# Patient Record
Sex: Female | Born: 1965 | Race: White | Hispanic: No | Marital: Married | Smoking: Current every day smoker
Health system: Southern US, Community
[De-identification: ages and names within clinical notes are randomized; demographics above are authoritative.]

## PROBLEM LIST (undated history)

## (undated) DIAGNOSIS — F32A Depression, unspecified: Secondary | ICD-10-CM

## (undated) DIAGNOSIS — N289 Disorder of kidney and ureter, unspecified: Secondary | ICD-10-CM

## (undated) DIAGNOSIS — F329 Major depressive disorder, single episode, unspecified: Secondary | ICD-10-CM

## (undated) DIAGNOSIS — I471 Supraventricular tachycardia, unspecified: Secondary | ICD-10-CM

## (undated) DIAGNOSIS — N390 Urinary tract infection, site not specified: Secondary | ICD-10-CM

## (undated) DIAGNOSIS — K219 Gastro-esophageal reflux disease without esophagitis: Secondary | ICD-10-CM

## (undated) DIAGNOSIS — D4959 Neoplasm of unspecified behavior of other genitourinary organ: Secondary | ICD-10-CM

## (undated) DIAGNOSIS — T8859XA Other complications of anesthesia, initial encounter: Secondary | ICD-10-CM

## (undated) DIAGNOSIS — I10 Essential (primary) hypertension: Secondary | ICD-10-CM

## (undated) DIAGNOSIS — I2510 Atherosclerotic heart disease of native coronary artery without angina pectoris: Secondary | ICD-10-CM

## (undated) DIAGNOSIS — Z8489 Family history of other specified conditions: Secondary | ICD-10-CM

## (undated) DIAGNOSIS — J449 Chronic obstructive pulmonary disease, unspecified: Secondary | ICD-10-CM

## (undated) DIAGNOSIS — I351 Nonrheumatic aortic (valve) insufficiency: Secondary | ICD-10-CM

## (undated) DIAGNOSIS — T4145XA Adverse effect of unspecified anesthetic, initial encounter: Secondary | ICD-10-CM

## (undated) HISTORY — PX: ABDOMINAL HYSTERECTOMY: SHX81

## (undated) HISTORY — DX: Atherosclerotic heart disease of native coronary artery without angina pectoris: I25.10

## (undated) HISTORY — DX: Gastro-esophageal reflux disease without esophagitis: K21.9

## (undated) HISTORY — PX: CHOLECYSTECTOMY: SHX55

## (undated) HISTORY — PX: VESICOVAGINAL FISTULA CLOSURE W/ TAH: SUR271

## (undated) HISTORY — DX: Supraventricular tachycardia, unspecified: I47.10

## (undated) HISTORY — DX: Depression, unspecified: F32.A

## (undated) HISTORY — DX: Major depressive disorder, single episode, unspecified: F32.9

## (undated) HISTORY — DX: Nonrheumatic aortic (valve) insufficiency: I35.1

## (undated) HISTORY — DX: Supraventricular tachycardia: I47.1

## (undated) HISTORY — PX: OTHER SURGICAL HISTORY: SHX169

## (undated) HISTORY — DX: Essential (primary) hypertension: I10

## (undated) HISTORY — PX: APPENDECTOMY: SHX54

---
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
## 2005-02-01 ENCOUNTER — Emergency Department: Payer: Self-pay | Admitting: General Practice

## 2005-03-23 ENCOUNTER — Ambulatory Visit: Payer: Self-pay

## 2005-03-24 ENCOUNTER — Ambulatory Visit: Payer: Self-pay | Admitting: Internal Medicine

## 2005-11-20 ENCOUNTER — Ambulatory Visit: Payer: Self-pay | Admitting: Internal Medicine

## 2006-04-07 ENCOUNTER — Ambulatory Visit: Payer: Self-pay | Admitting: Internal Medicine

## 2006-05-26 ENCOUNTER — Ambulatory Visit: Payer: Self-pay | Admitting: Internal Medicine

## 2006-06-03 ENCOUNTER — Ambulatory Visit: Payer: Self-pay | Admitting: Internal Medicine

## 2006-08-12 ENCOUNTER — Ambulatory Visit: Payer: Self-pay

## 2006-10-13 ENCOUNTER — Encounter
Admission: RE | Admit: 2006-10-13 | Discharge: 2006-10-13 | Payer: Self-pay | Admitting: Physical Medicine and Rehabilitation

## 2007-01-25 ENCOUNTER — Ambulatory Visit (HOSPITAL_COMMUNITY): Admission: RE | Admit: 2007-01-25 | Discharge: 2007-01-25 | Payer: Self-pay | Admitting: Neurosurgery

## 2007-04-28 ENCOUNTER — Emergency Department: Payer: Self-pay

## 2007-05-15 ENCOUNTER — Emergency Department: Payer: Self-pay | Admitting: Emergency Medicine

## 2007-06-13 IMAGING — CR DG CERVICAL SPINE COMPLETE 4+V
5 series · 5 of 5 positions shown · non-contrast
Comparison: none

CLINICAL DATA: Pain without trauma

Cervical spine five-view:
No previous for comparison. There is no evidence of cervical spine fracture or
prevertebral soft tissue swelling.  Alignment is normal.  No other significant
bone abnormalities are identified.

[w c-spine lat]
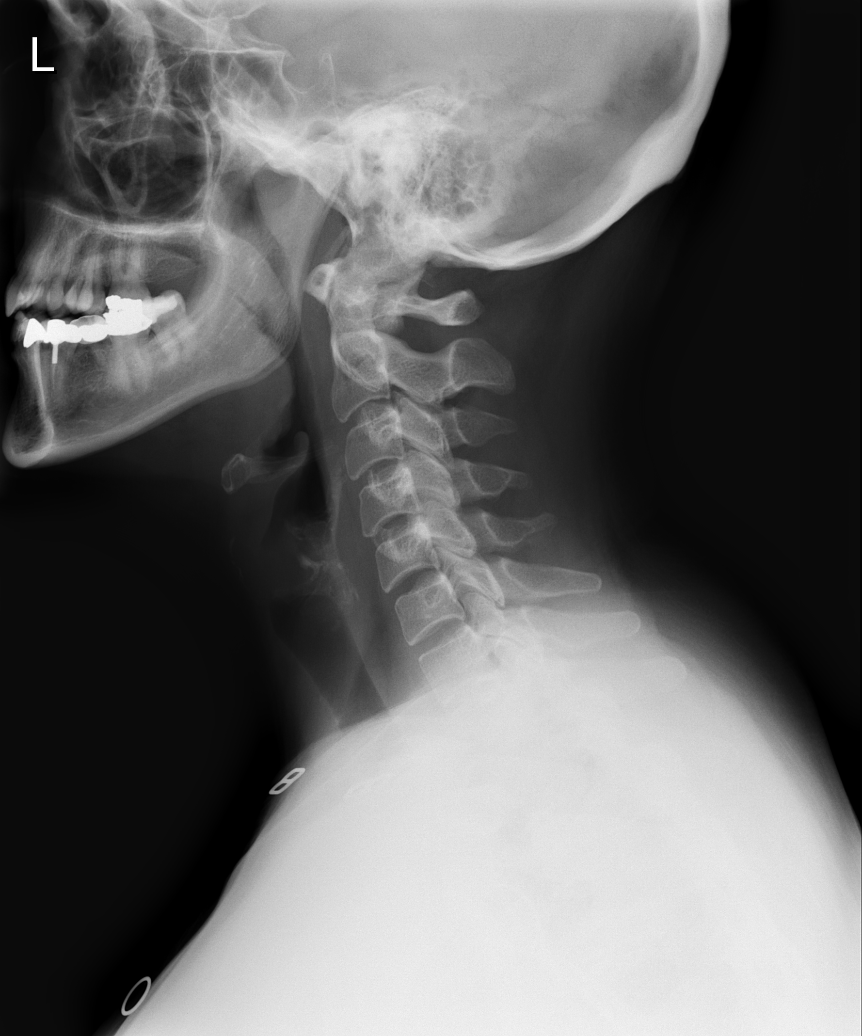

[w c-spine oblique (1 of 2)]
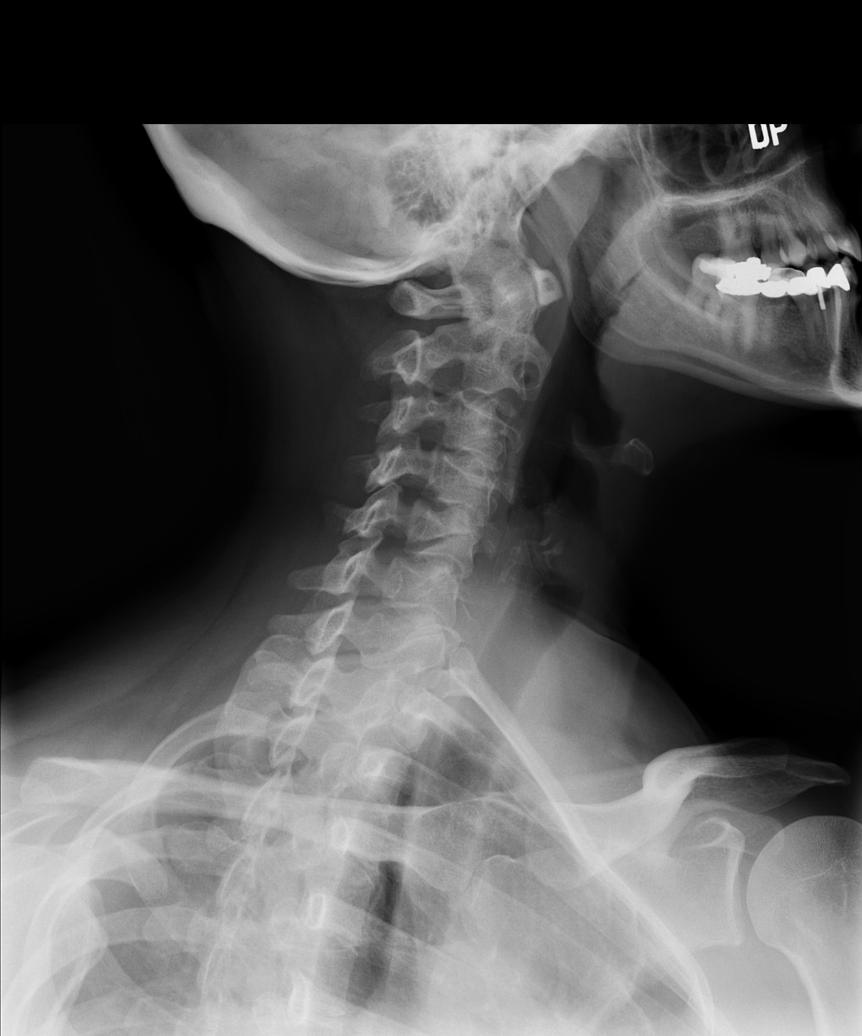

[w c-spine oblique (2 of 2)]
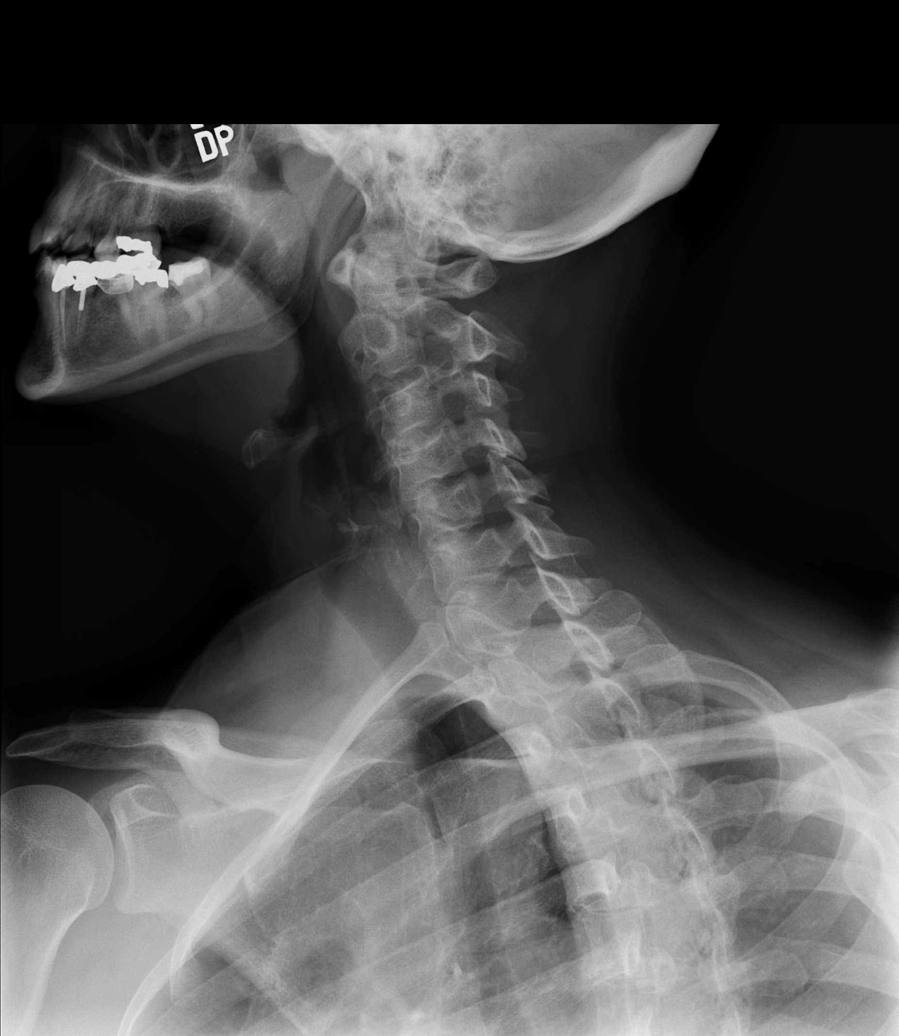

[w c-spine a.p. *]
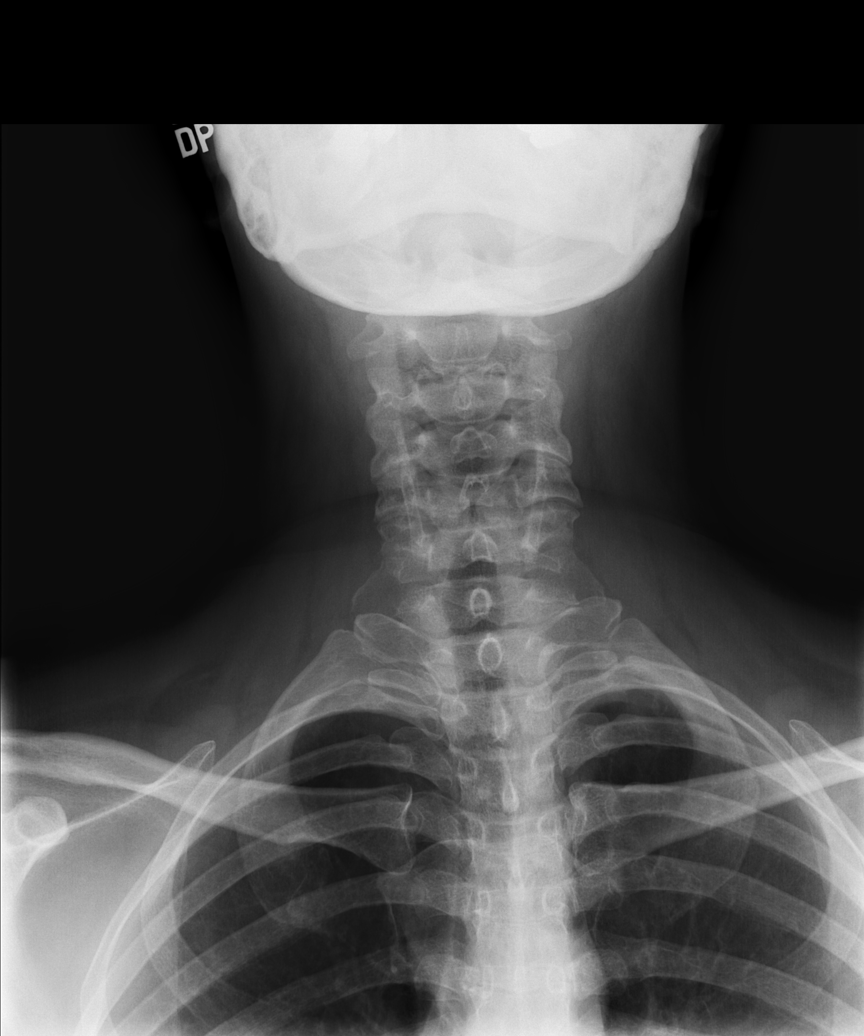

[w c-spine odontoid *]
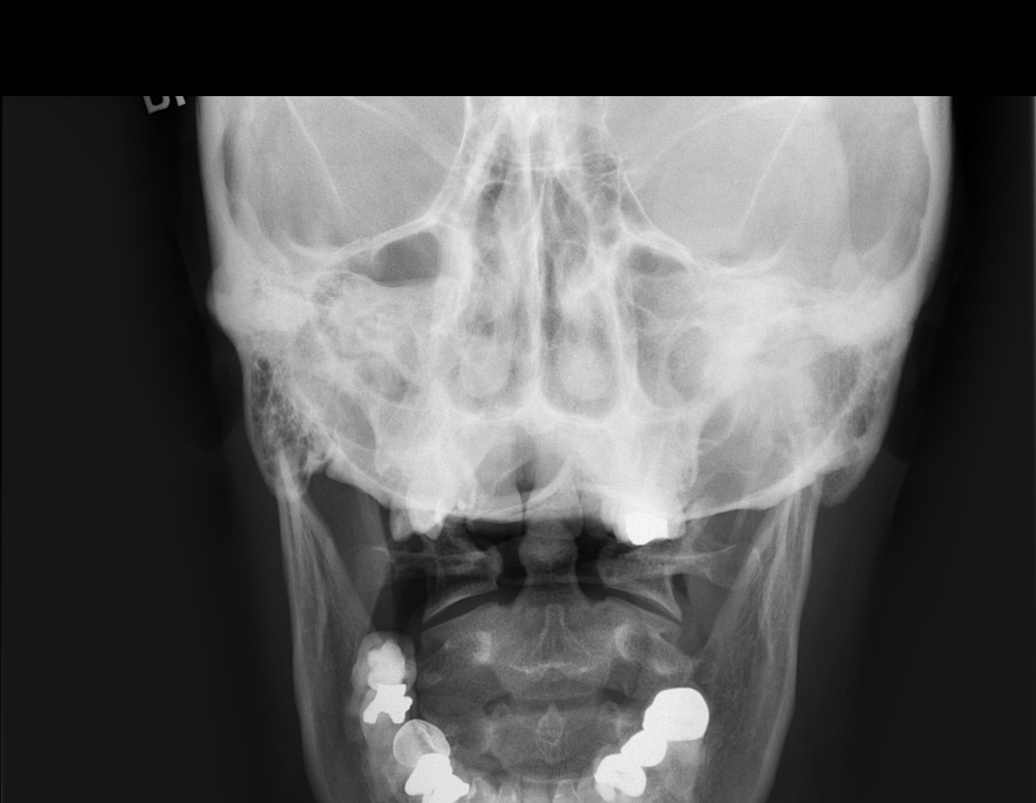

[5 of 5 positions shown; findings below may reference images not displayed]

IMPRESSION: Negative cervical spine radiographs.

## 2007-06-28 ENCOUNTER — Ambulatory Visit: Payer: Self-pay | Admitting: Pain Medicine

## 2007-07-06 ENCOUNTER — Ambulatory Visit: Payer: Self-pay | Admitting: Pain Medicine

## 2007-08-29 ENCOUNTER — Emergency Department: Payer: Self-pay | Admitting: Internal Medicine

## 2007-09-01 ENCOUNTER — Ambulatory Visit: Payer: Self-pay | Admitting: Gastroenterology

## 2007-09-05 ENCOUNTER — Ambulatory Visit: Payer: Self-pay | Admitting: Surgery

## 2007-09-21 ENCOUNTER — Ambulatory Visit: Payer: Self-pay | Admitting: Internal Medicine

## 2008-02-23 DIAGNOSIS — M545 Low back pain, unspecified: Secondary | ICD-10-CM | POA: Insufficient documentation

## 2008-05-21 IMAGING — CR DG CHEST 2V
1 series · 2 of 2 positions shown · non-contrast
Comparison: none

REASON FOR EXAM: cough fever
COMMENTS:

PROCEDURE:     DXR - DXR CHEST PA (OR AP) AND LATERAL  - September 21, 2007  [DATE]
RESULT:     The lungs are clear. The heart and pulmonary vessels are normal.
The bony and mediastinal structures are unremarkable. There is no effusion.
There is no pneumothorax or evidence of congestive failure.

[Series 1: view not recorded · 0.17mm/px · 2 of 2 slices shown]
[im 1/2]
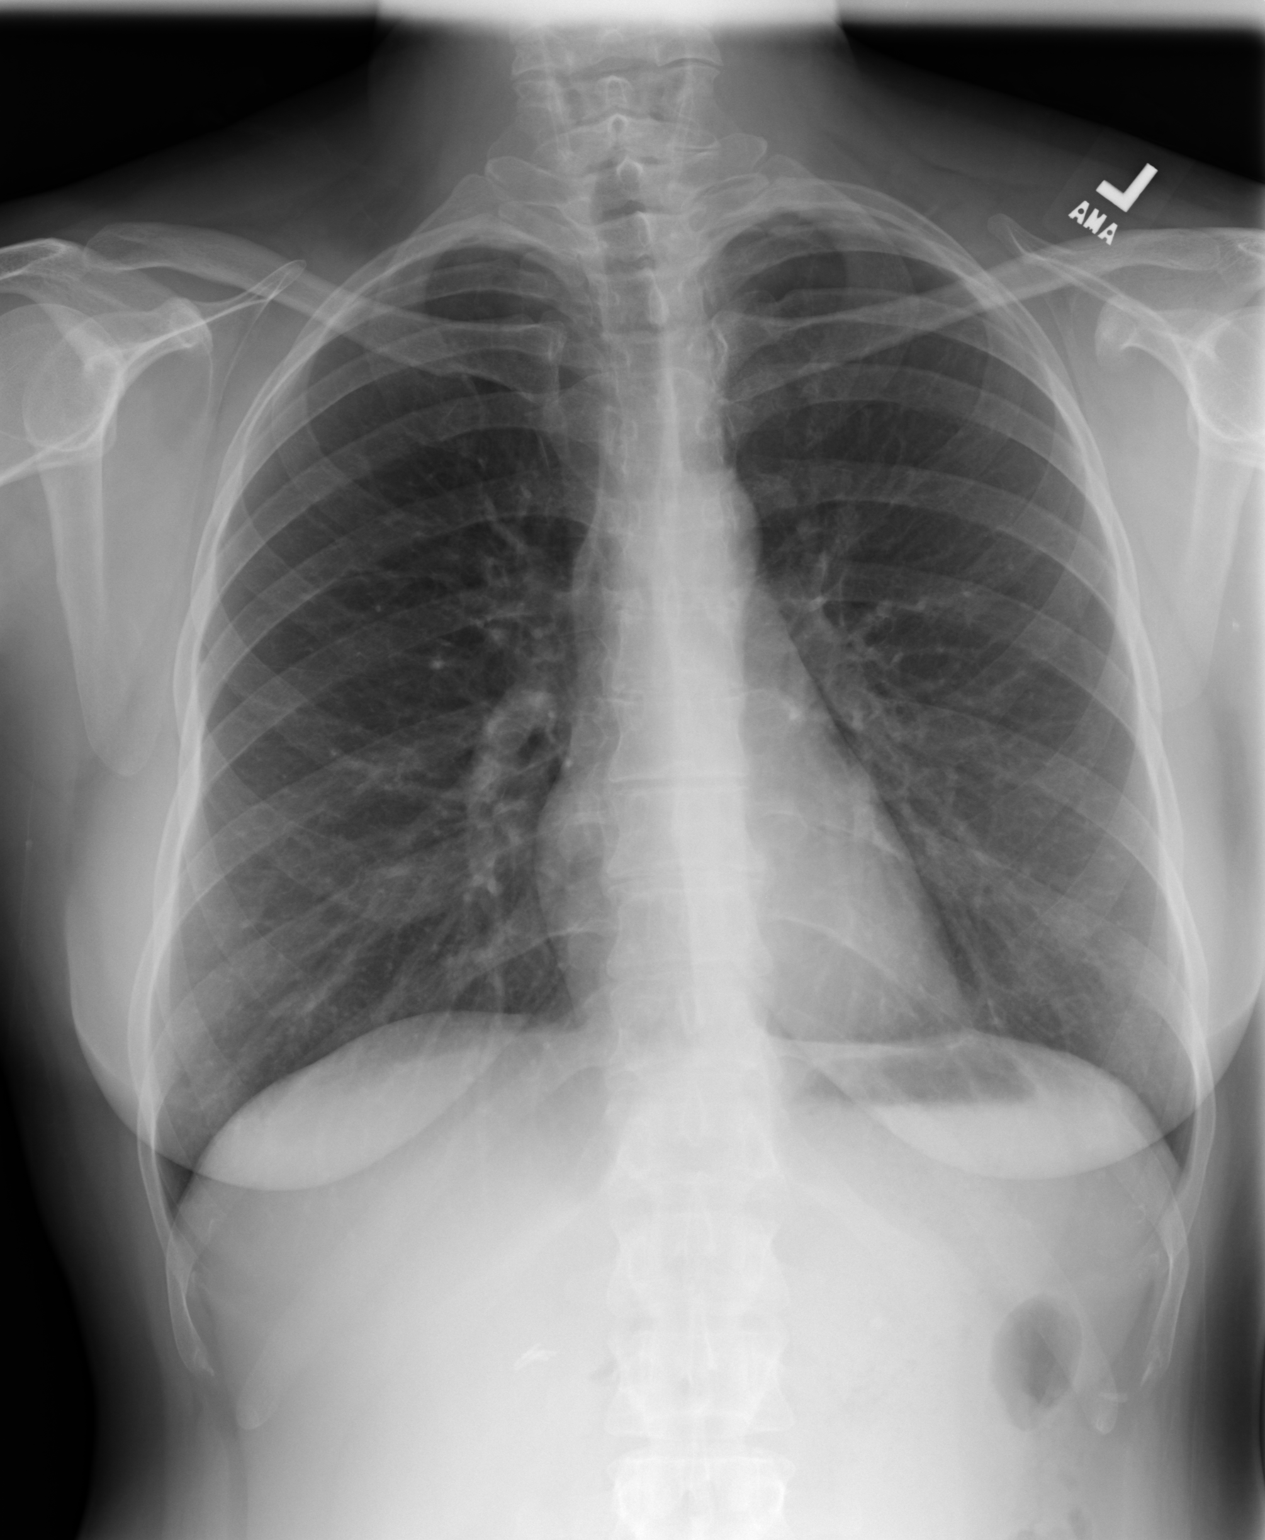
[im 2/2]
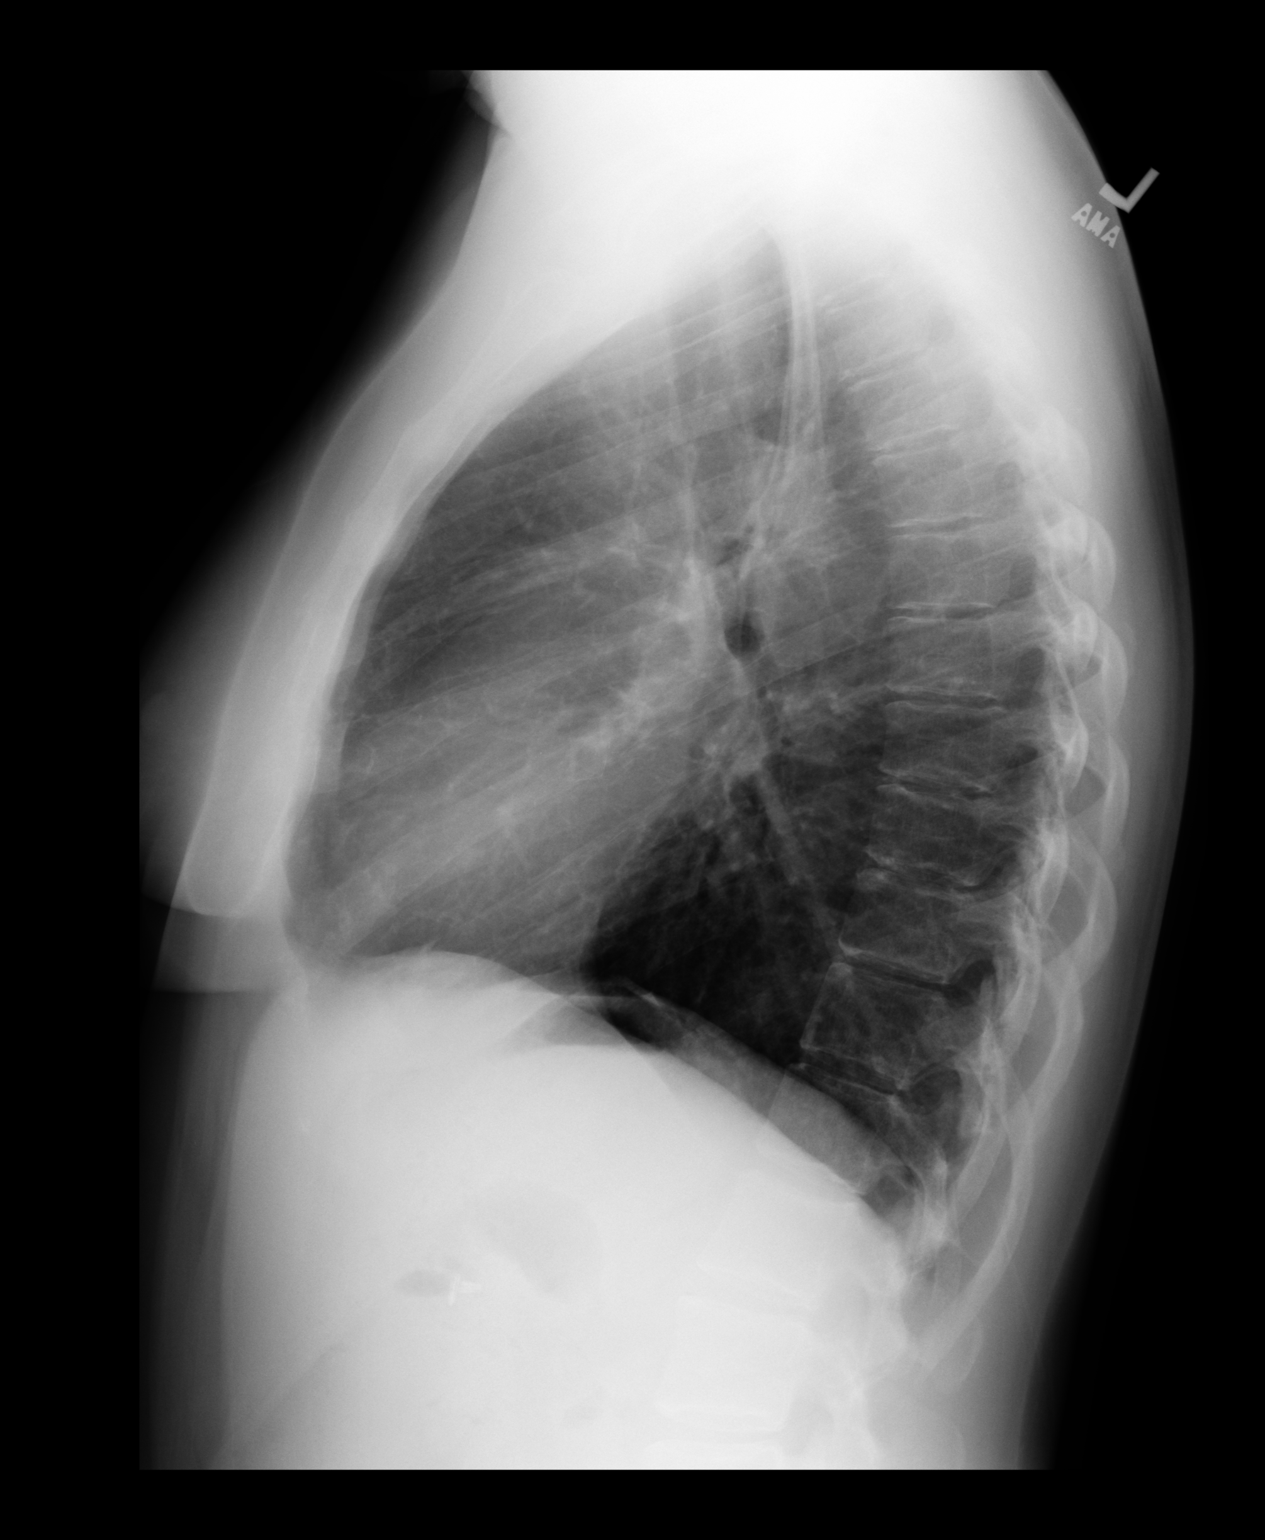

[2 of 2 positions shown; findings below may reference images not displayed]

IMPRESSION: No acute cardiopulmonary disease.

## 2008-06-12 ENCOUNTER — Emergency Department: Payer: Self-pay | Admitting: Internal Medicine

## 2009-03-21 ENCOUNTER — Ambulatory Visit: Payer: Self-pay | Admitting: Oncology

## 2009-04-08 ENCOUNTER — Ambulatory Visit: Payer: Self-pay | Admitting: Oncology

## 2009-04-16 ENCOUNTER — Ambulatory Visit: Payer: Self-pay | Admitting: Family Medicine

## 2009-04-20 ENCOUNTER — Ambulatory Visit: Payer: Self-pay | Admitting: Oncology

## 2009-05-30 ENCOUNTER — Ambulatory Visit: Payer: Self-pay | Admitting: Internal Medicine

## 2009-07-04 ENCOUNTER — Ambulatory Visit: Payer: Self-pay | Admitting: Internal Medicine

## 2009-08-12 ENCOUNTER — Ambulatory Visit (HOSPITAL_COMMUNITY): Admission: RE | Admit: 2009-08-12 | Discharge: 2009-08-12 | Payer: Self-pay | Admitting: Neurosurgery

## 2010-06-16 ENCOUNTER — Emergency Department: Payer: Self-pay | Admitting: Emergency Medicine

## 2010-06-16 ENCOUNTER — Encounter: Payer: Self-pay | Admitting: Cardiovascular Disease

## 2010-06-17 ENCOUNTER — Encounter: Payer: Self-pay | Admitting: Cardiovascular Disease

## 2010-06-19 ENCOUNTER — Ambulatory Visit: Payer: Self-pay | Admitting: Cardiovascular Disease

## 2010-06-19 DIAGNOSIS — I471 Supraventricular tachycardia, unspecified: Secondary | ICD-10-CM | POA: Insufficient documentation

## 2010-06-19 DIAGNOSIS — R072 Precordial pain: Secondary | ICD-10-CM | POA: Insufficient documentation

## 2010-06-19 DIAGNOSIS — R079 Chest pain, unspecified: Secondary | ICD-10-CM | POA: Insufficient documentation

## 2010-06-19 DIAGNOSIS — I498 Other specified cardiac arrhythmias: Secondary | ICD-10-CM | POA: Insufficient documentation

## 2010-06-20 ENCOUNTER — Encounter: Payer: Self-pay | Admitting: Cardiovascular Disease

## 2010-06-26 ENCOUNTER — Encounter: Payer: Self-pay | Admitting: Cardiovascular Disease

## 2010-06-26 ENCOUNTER — Ambulatory Visit: Payer: Self-pay

## 2010-06-27 ENCOUNTER — Ambulatory Visit: Payer: Self-pay | Admitting: Internal Medicine

## 2010-06-27 DIAGNOSIS — I739 Peripheral vascular disease, unspecified: Secondary | ICD-10-CM | POA: Insufficient documentation

## 2010-06-27 DIAGNOSIS — F17200 Nicotine dependence, unspecified, uncomplicated: Secondary | ICD-10-CM | POA: Insufficient documentation

## 2010-06-27 DIAGNOSIS — R030 Elevated blood-pressure reading, without diagnosis of hypertension: Secondary | ICD-10-CM

## 2010-07-11 ENCOUNTER — Emergency Department: Payer: Self-pay | Admitting: Emergency Medicine

## 2010-08-16 ENCOUNTER — Ambulatory Visit: Payer: Self-pay | Admitting: Internal Medicine

## 2010-09-16 ENCOUNTER — Telehealth: Payer: Self-pay | Admitting: Internal Medicine

## 2011-01-20 NOTE — Miscellaneous (Signed)
  Clinical Lists Changes  Medications: Rx of DILTIAZEM HCL 30 MG TABS (DILTIAZEM HCL) 1 tablet four times a day as needed for rapid heart beats (SVT);  #120 x 1;  Signed;  Entered by: Benedict Needy, RN;  Authorized by: Dossie Arbour MD;  Method used: Electronically to [id number] [address] [place], [place]  [address], Ph: [phone] or [phone], Fax: [phone] Orders: Added new Referral order of Echocardiogram (Echo) - Signed    Prescriptions: DILTIAZEM HCL 30 MG TABS (DILTIAZEM HCL) 1 tablet four times a day as needed for rapid heart beats (SVT)  #120 x 1   Entered by:   Benedict Needy, RN   Authorized by:   Dossie Arbour MD   Signed by:   Benedict Needy, RN on 06/20/2010   Method used:   Electronically to        [id number] * (retail)       [address]       [place], [place]  [address]       Ph: [phone] or [phone]       Fax: [phone]   RxID:   [id number]

## 2011-01-20 NOTE — Progress Notes (Signed)
Summary: NO SHOWS  Phone Note Outgoing Call   Call placed by: Benedict Needy, RN,  September 16, 2010 3:22 PM Call placed to: [facility] Summary of Call: Pt has cancelled 4 echo appts and no showed for one. Pt also no showed for her appt with Dr. Graciela Husbands Sept 26.  Per Dr. Graciela Husbands we are to not schedule pt for echo appt again.  Initial call taken by: Benedict Needy, RN,  September 16, 2010 3:23 PM

## 2011-01-20 NOTE — Assessment & Plan Note (Signed)
Summary: SVT/CP   Visit Type:  Initial Consult Primary Provider:  Nedlam Khan,M.D.  CC:  SVT/chest pain.  History of Present Illness: [name] is a 45 year old woman with no significant past cardiac history presents for new patient evaluation for SVT.  She reports that over the past month, she has had 4 episodes of tachypalpitations. Her first episode was approximately 3 weeks ago. her last episode was 4 days ago. It started at noon and it continued until early evening. She waited for 5 hours before going to the emergency room. in the emergency room, she was given adenosine IV which broke her rhythm. She did not want to stay overnight for further evaluation and she was discharged home.  She had a episode several days prior to that on Saturday in the afternoon and reports having syncope. At the time her heart was beating very rapidly, blood pressure cuff measuring heart rates of 190.  Episode prior to that was one week before as was her first episode which was one week before that.  She's never had episodes like this before. She denies any new medications. No herbal drinks or other stimulants, no dietary supplements. Prior to this, she had no significant symptoms of shortness of breath. She did have a little bit of chest discomfort when she had her fast rhythms.  EKG today shows normal sinus rhythm with a rate of 97 beats per minute, no significant ST or T wave changes.  Current Medications (verified): 1)  Omeprazole 40 Mg Cpdr (Omeprazole) .Marland Kitchen.. 1 Once Daily 2)  Neurontin 600 Mg Tabs (Gabapentin) .... Three Times A Day 3)  Pristiq 100 Mg Xr24h-Tab (Desvenlafaxine Succinate) .Marland Kitchen.. 1 Once Daily 4)  Tylenol 325 Mg Tabs (Acetaminophen) .... As Needed 5)  Tylenol Pm Extra Strength 500-25 Mg Tabs (Diphenhydramine-Apap (Sleep)) .Marland Kitchen.. 1 At Bedtime  Allergies (verified): No Known Drug Allergies  Past History:  Past Surgical History: appendectomy hysterectomy C-section x3  Social  History: Tobacco Use - Yes.  Alcohol Use - no Drug Use - no Single   Review of Systems  The patient denies fever, weight loss, weight gain, vision loss, decreased hearing, hoarseness, chest pain, syncope, dyspnea on exertion, peripheral edema, prolonged cough, abdominal pain, incontinence, muscle weakness, depression, and enlarged lymph nodes.         tachyarrhythmia/SVT episodes  Vital Signs:  Patient profile:   45 year old female Height:      64 inches Weight:      155 pounds BMI:     26.70 Pulse rate:   97 / minute BP sitting:   120 / 78  (left arm) Cuff size:   regular  Vitals Entered By: [place] Dublin, CMA (June 19, 2010 4:33 PM)  Physical Exam  General:  Well developed, well nourished, in no acute distress. Head:  normocephalic and atraumatic Neck:  Neck supple, no JVD. No masses, thyromegaly or abnormal cervical nodes. Lungs:  Clear bilaterally to auscultation and percussion. Heart:  Non-displaced PMI, chest non-tender; regular rate and rhythm, S1, S2 without murmurs, rubs or gallops. Carotid upstroke normal, no bruit.  Pedals normal pulses. No edema, no varicosities. Abdomen:  Bowel sounds positive; abdomen soft and non-tender without masses Msk:  Back normal, normal gait. Muscle strength and tone normal. Pulses:  pulses normal in all 4 extremities Extremities:  No clubbing or cyanosis. Neurologic:  Alert and oriented x 3. Skin:  Intact without lesions or rashes. Psych:  Normal affect.   Impression & Recommendations:  Problem # 1:  SUPRAVENTRICULAR TACHYCARDIA (ICD-427.89) we have her EKG from the emergency room documenting supraventricular tachycardia with rate of 171 beats per minute. We have given her the various treatment options include continued medical management with daily medication, medications p.r.n., and discussion about ablation with EP. She is very concerned that when she has these episodes, she will be unable to take care of her 62-year-old daughter.  She is debilitated when she has these episodes with significant shortness of breath due to the rapid rate. She would be very interested in ablation.  We have given her short-acting diltiazem to be taken as needed if she has additional runs of SVT prior to the appointment with Dr. Graciela Husbands.  Her updated medication list for this problem includes:    Diltiazem Hcl 30 Mg Tabs (Diltiazem hcl) .Marland Kitchen... 1 tablet four times a day as needed for rapid heart beats (svt)  Problem # 2:  CHEST PAIN UNSPECIFIED (ICD-786.50) Her shortness of breath and chest discomfort the setting of rapid rhythm is likely due to a rate issue. She is otherwise asymptomatic at rest.  We will order an echocardiogram to confirm that she has normal cardiac anatomy Prior to a possible ablation  Her updated medication list for this problem includes:    Diltiazem Hcl 30 Mg Tabs (Diltiazem hcl) .Marland Kitchen... 1 tablet four times a day as needed for rapid heart beats (svt)  Orders: EKG w/ Interpretation ([id number])  Patient Instructions: 1)  Your physician has recommended you make the following change in your medication: Diltiazem 30mg  one tablet four times a day. 2)  You have been referred to F/U with Dr. Berton Mount for SVT regarding possible ablation. 3)  Your physician has requested that you have an echocardiogram.  Echocardiography is a painless test that uses sound waves to create images of your heart. It provides your doctor with information about the size and shape of your heart and how well your heart's chambers and valves are working.  This procedure takes approximately one hour. There are no restrictions for this procedure.   Prescriptions: DILTIAZEM HCL 30 MG TABS (DILTIAZEM HCL) 1 tablet four times a day as needed for rapid heart beats (SVT)  #120 x 1   Entered by:   [place] Dublin, CMA   Authorized by:   Dossie Arbour MD   Signed by:   [place] Dublin, CMA on 06/19/2010   Method used:   Electronically to        [facility]* (retail)        [address] [place], [place]  [address]       Ph: [phone]       Fax: [phone]   RxID:   [id number]

## 2011-01-20 NOTE — Assessment & Plan Note (Signed)
Summary: NEW EP PT   Visit Type:  Initial Consult Primary Provider:  Nedlam Khan,M.D.  CC:  no complaints this week -- SVT.  History of Present Illness: [name] is a 45 year old woman   asked to see by Dr. Mariah Milling  for SVT.  She  she has a history of tachycardia palpitations that date back to when she is a young girl. These episodes lasted a few minutes. They persisted to her childbearing years and then abated.  About a month ago she began having recurrent tachypalpitations considerably different from what she had before. These were "with severe. They lasted as long as "all day long". They were associated with shortness of breath lightheadedness and presyncope as well as chest discomfort. They were frog positive The diuretic negative.   She went to the emergency room and was given adenosine IV which broke her rhythm. She did not want to stay overnight for further evaluation and she was discharged home.  No herbal drinks or other stimulants, no dietary supplements. Prior to this, she had no significant symptoms of shortness of breath. She did have a little bit of chest discomfort when she had her fast rhythms.  she is undergone an echo subsequently pushed it is a normal left ventricular function and no AV valvular malformations.  The patient also describes cramping in her legs bilaterally with extended walking relieved with momentary  rest    .  Current Medications (verified): 1)  Omeprazole 40 Mg Cpdr (Omeprazole) .... Take 1 Tablet By Mouth Two Times A Day 2)  Neurontin 600 Mg Tabs (Gabapentin) .... Three Times A Day 3)  Pristiq 100 Mg Xr24h-Tab (Desvenlafaxine Succinate) .Marland Kitchen.. 1 Once Daily 4)  Tylenol 325 Mg Tabs (Acetaminophen) .... As Needed 5)  Tylenol Pm Extra Strength 500-25 Mg Tabs (Diphenhydramine-Apap (Sleep)) .Marland Kitchen.. 1 At Bedtime 6)  Diltiazem Hcl 30 Mg Tabs (Diltiazem Hcl) .Marland Kitchen.. 1 Tablet Four Times A Day As Needed For Rapid Heart Beats (Svt)  Allergies (verified): No  Known Drug Allergies  Past History:  Past Medical History: Last updated: 06/19/2010 Depression GERD SVT  Past Surgical History: Last updated: 06/19/2010 appendectomy hysterectomy C-section x3  Family History: Last updated: 06/19/2010 Family History of Coronary Artery Disease: Grandfather Family History of Hypertension: Mother  Social History: Last updated: 06/19/2010 Tobacco Use - Yes.  Alcohol Use - no Drug Use - no Single   Review of Systems       full review of systems was negative apart from a history of present illness and past medical history arthirits   Vital Signs:  Patient profile:   45 year old female Height:      64 inches Weight:      154 pounds BMI:     26.53 Pulse rate:   99 / minute BP sitting:   135 / 89  (left arm) Cuff size:   regular  Vitals Entered By: Hardin Negus, RMA (June 27, 2010 4:03 PM)  Physical Exam  General:  Well developed, well nourished, in no acute distress. Head:  normal HEENT Neck:  supple without thyromegaly Chest Wall:  without CVA tenderness Lungs:  clear to auscultation Heart:  regular rate and rhythm without murmurs or gallops Abdomen:  soft nontender with active bowel sounds hepatomegaly or midline pulsation Msk:  normal gait and skeletal features Pulses:  decreased distal pulses Extremities:  no clubbing cyanosis or edema Neurologic:  alert and oriented grossly normal motor and sensory function Skin:  warm and dry Cervical Nodes:  no lymphadenopathy Psych:  normal affect   EKG  Procedure date:  06/27/2010  Findings:      electrocardiogram of SVT demonstrated narrow QRS at a cycle length of 300 ms or so; a pseudo-R. 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 Her long-standing history dating back to her younger years and would suggest AV reentry. In any case we discussed treatment options including p.r.n. versus daily beta blockers/calcium blockers, antiarrhythmic drugs with potential for proarrhythmia, and catheter ablation. We reviewed the potential benefits as well as the potential risks including but not limited to death perforation vascular injury and heart block requiring pacemaker implantation. She would at this point like to pursue medical therapy on an as-needed basis. We thus reviewed vagal maneuvers that might help terminate her spells.  We'll plan to review the situation in 2-3 months. She is advised to call for recurrent symptoms. Her updated medication list for this problem includes:    Diltiazem Hcl 30 Mg Tabs (Diltiazem hcl) .Marland Kitchen... 1 tablet four times a day as needed for rapid heart beats (svt)  Problem # 2:  ELEVATED BLOOD PRESSURE WITHOUT DIAGNOSIS OF HYPERTENSION (ICD-796.2) Her blood pressure is elevated today; this is not a previous diagnosis. She is advised to keep track of her blood pressures.  Problem # 3:  CLAUDICATION (ICD-443.9) she has symptoms consistent with claudication. Given that she smokes will undertake ABIs. There is also medications as to the presence of coronary disease in fact she is vascular disease. In the event that she were to have abnormal ABIs, fairly prior to EP testing, I would undertake Myoview scanning.  Problem # 4:  TOBACCO USER (ICD-305.1) she is encouraged to stop smoking.  Other Orders: Arterial Duplex Lower Extremity (Arterial Duplex Low)  Patient Instructions: 1)  Your physician has requested that you have an ankle brachial index (ABI). During this test an ultrasound and blood pressure cuff are used to evaluate the arteries that supply the arms and legs with blood. Allow thirty minutes for this exam. There are no restrictions or special instructions. 2)  Your  physician recommends that you schedule a follow-up appointment in: 8-12 WEEKS.

## 2011-01-20 NOTE — Letter (Signed)
Summary: Historic Patient File  Historic Patient File   Imported By: [place] 06/20/2010 11:58:32  _____________________________________________________________________  External Attachment:    Type:   Image     Comment:   External Document

## 2011-05-08 NOTE — Op Note (Signed)
Amy Ferrell, Amy Ferrell           ACCOUNT NO.:  [id number]   MEDICAL RECORD NO.:  [id number]          PATIENT TYPE:  AMB   LOCATION:  SDS                          FACILITY:  [facility]   PHYSICIAN:  Reinaldo Meeker, M.D. DATE OF BIRTH:  December 11, 1966   DATE OF PROCEDURE:  DATE OF DISCHARGE:                               OPERATIVE REPORT   PREOPERATIVE DIAGNOSIS:  Right carpal tunnel syndrome.   POSTOPERATIVE DIAGNOSIS:  Right carpal tunnel syndrome.   PROCEDURE:  Right carpal tunnel release.   SURGEON:  Aliene Beams, MD   PROCEDURE IN DETAIL:  After initiation of regional anesthetic, the  patient's hand, wrist, and arm were prepped and draped in the usual  sterile fashion on the right.  A Curvilinear incision was then made  starting at the wrist in line with the ring finger and to the palm and  then a slightly radial direction. Subcutaneous fat was dissected.  Self-  retaining retractor was placed for exposure.  Underlying transverse  carpal ligament was easily identified. Starting in a proximal to distal  direction, the ligament was incised to decompress the underlying medial  nerve.  Neuro decompression was carried out into the mid palm and up  into the distal wrist in order to completely decompress the nerve. Any  adhesions around the nerve were also dissected free, so the nerve was  completely of any soft tissues adhesions.  At this time, irrigation was  carried out. Any bleeding was controlled  with proper coagulation.  The  wound was then closed with interrupted with Vicryl in the subcutaneous  fat and interrupted nylon on the skin.  A bulky sterile dressing was  then applied and the patient was taken to recovery room in stable  condition.           ______________________________  Reinaldo Meeker, M.D.     ROK/MEDQ  D:  01/25/2007  T:  01/25/2007  Job:  [id number]

## 2011-05-11 ENCOUNTER — Ambulatory Visit: Payer: Self-pay | Admitting: Internal Medicine

## 2011-06-26 ENCOUNTER — Ambulatory Visit: Payer: Self-pay | Admitting: Internal Medicine

## 2011-07-03 ENCOUNTER — Encounter: Payer: Self-pay | Admitting: Internal Medicine

## 2012-07-28 ENCOUNTER — Inpatient Hospital Stay: Payer: Self-pay | Admitting: Psychiatry

## 2012-07-28 LAB — URINALYSIS, COMPLETE
Ketone: NEGATIVE
Nitrite: NEGATIVE
Ph: 6 (ref 4.5–8.0)
Protein: NEGATIVE
Specific Gravity: 1.005 (ref 1.003–1.030)
Squamous Epithelial: NONE SEEN

## 2012-07-28 LAB — DRUG SCREEN, URINE
Benzodiazepine, Ur Scrn: POSITIVE (ref ?–200)
Methadone, Ur Screen: NEGATIVE (ref ?–300)
Tricyclic, Ur Screen: NEGATIVE (ref ?–1000)

## 2012-07-28 LAB — COMPREHENSIVE METABOLIC PANEL
Anion Gap: 4 — ABNORMAL LOW (ref 7–16)
BUN: 8 mg/dL (ref 7–18)
Bilirubin,Total: 0.3 mg/dL (ref 0.2–1.0)
Chloride: 109 mmol/L — ABNORMAL HIGH (ref 98–107)
Co2: 29 mmol/L (ref 21–32)
Creatinine: 0.71 mg/dL (ref 0.60–1.30)
EGFR (African American): >60
EGFR (Non-African Amer.): >60
Glucose: 97 mg/dL (ref 65–99)
Osmolality: 281 (ref 275–301)
Potassium: 4.6 mmol/L (ref 3.5–5.1)
SGPT (ALT): 21 U/L (ref 12–78)
Sodium: 142 mmol/L (ref 136–145)
Total Protein: 7.6 g/dL (ref 6.4–8.2)

## 2012-07-28 LAB — CBC
MCV: 99 fL (ref 80–100)
Platelet: 481 10*3/uL — ABNORMAL HIGH (ref 150–440)
RBC: 4.43 10*6/uL (ref 3.80–5.20)
WBC: 8.1 10*3/uL (ref 3.6–11.0)

## 2012-07-28 LAB — TSH: Thyroid Stimulating Horm: 0.91 u[IU]/mL

## 2012-07-28 LAB — ETHANOL: Ethanol %: <0.003 % (ref 0.000–0.080)

## 2012-10-05 ENCOUNTER — Emergency Department: Payer: Self-pay | Admitting: *Deleted

## 2013-01-28 ENCOUNTER — Emergency Department: Payer: Self-pay | Admitting: Emergency Medicine

## 2013-12-06 DIAGNOSIS — K219 Gastro-esophageal reflux disease without esophagitis: Secondary | ICD-10-CM | POA: Insufficient documentation

## 2013-12-06 DIAGNOSIS — F32A Depression, unspecified: Secondary | ICD-10-CM | POA: Insufficient documentation

## 2015-04-09 NOTE — Consult Note (Signed)
Brief Consult Note: Diagnosis: polysubstance dependence.   Patient was seen by consultant.   Recommend further assessment or treatment.   Orders entered.   Discussed with Attending MD.   Comments: Psychiatry: Patient seen. Patient came to ER seeking detox from opiates. Also abusing xanax and cocaine. Emotionally distraught. Admit to [facility].  Electronic Signatures: Gonzella Lex (MD)  (Signed 08-Aug-13 18:05)  Authored: Brief Consult Note   Last Updated: 08-Aug-13 18:05 by Gonzella Lex (MD)

## 2015-04-09 NOTE — Discharge Summary (Signed)
PATIENT NAME:  Amy Ferrell, Amy Ferrell MR#:  [id number] DATE OF BIRTH:  1966/06/11  DATE OF ADMISSION:  07/28/2012 DATE OF DISCHARGE:  08/01/2012  HOSPITAL COURSE: See dictated history and physical for details. This 49 year old woman presented to the Emergency Room requesting detox from opiates. Also had been using benzodiazepines. She had been agitated, showing mood lability, had been off of her psychiatric medicine with increased anxiety. In the hospital she was given medicine to treat opiate withdrawal symptoms. She detoxed uneventfully. She was shaky and felt bad for the first couple of days. She took Robaxin on a regular p.r.n. basis which has helped with the pain. At the time of discharge her vital signs are normal except that this morning she did have a transient low blood pressure. She briefly fainted and slumped to the floor but did not hit her head. At this point she has recovered from it. She reports that this has happened to her at times in the past. Her affect has improved and calmed down. She has been able to interact with other patients appropriately. She has engaged in counseling about substance abuse and mood disorders. It was recommended to her that she consider going to the alcohol and drug abuse treatment center in [place] for further treatment but she declined this suggestion because she wants to get home to her children. She will be referred to [facility] in the community for intensive outpatient treatment as well as medication management. Because of her financial situation her antidepressants were changed to Effexor rather than her previous Cymbalta. She has tolerated this fine. She is also tolerating the Abilify and trazodone that she has taken in the past fine. She has not engaged in any dangerous or suicidal behavior and is not making any suicidal statements and is optimistic about getting back to her family.   DISCHARGE MEDICATIONS:  1. Venlafaxine XR 75 mg per a.m.  2. Abilify 10 mg at  bedtime.  3. Trazodone 100 mg at bedtime.  4. Robaxin 1500 mg every eight hours p.r.n. for pain.   LABORATORY, DIAGNOSTIC AND RADIOLOGICAL DATA: Chemistries showed a drug screen positive for cocaine and benzodiazepines. TSH normal. Alcohol undetectable. Chemistry panel showed an elevated chloride at 109. Hematology panel showed an elevated platelet count at 481. Urinalysis borderline for infection.   MENTAL STATUS EXAM AT DISCHARGE: Casually dressed, neatly groomed woman looks her stated age. Cooperative with the interview. Good eye contact. Normal psychomotor activity. Speech is normal in rate, tone, and volume. Affect remained somewhat anxious but reactive. Mood is stated as being okay. Thoughts are lucid with no obvious loosening of associations or delusional thinking. Denies suicidal or homicidal ideation. Denies acute hopelessness or helplessness. Says she is optimistic and looking forward to getting back to her family. Judgment and insight reasonably okay.   DISPOSITION: Discharged home to her family. Follow-up with [facility] for medication management as well as intensive outpatient treatment.   DIAGNOSIS PRINCIPLE AND PRIMARY:  AXIS I: Polysubstance dependence including cocaine, opiates, and benzodiazepines.   SECONDARY DIAGNOSES:  AXIS I: Depression, not otherwise specified.   AXIS II: Deferred.   AXIS III: No diagnosis.   AXIS IV: Moderate to severe from raising her children while continuing to have active mental health issues and financial difficulties.   AXIS V: Functioning at time of discharge 56.   ____________________________ Gonzella Lex, MD jtc:cms D: 08/01/2012 11:42:43 ET T: 08/01/2012 14:57:40 ET JOB#: [id number]  cc: Gonzella Lex, MD, <Dictator>  Gonzella Lex MD ELECTRONICALLY SIGNED 08/02/2012  13:01 

## 2015-04-09 NOTE — H&P (Signed)
PATIENT NAME:  Amy Ferrell, Amy Ferrell MR#:  [id number] DATE OF BIRTH:  1966/06/24  DATE OF ADMISSION:  07/28/2012  IDENTIFYING INFORMATION AND CHIEF COMPLAINT: 49 year old woman came to the Emergency Room initially was voluntary and was stating that she wanted to get detoxed from opiates.   CHIEF COMPLAINT: "I just want to get off these medicines."   HISTORY OF PRESENT ILLNESS: Information obtained from the patient and the old chart. Patient says she came to the Emergency Room today because she feels that her opiate and cocaine abuse problem has gotten out of control. She is using oral opiate medicines that she buys on the street at increasingly large amounts and smoking crack cocaine every day. Her drug problem has been present for about 2 or 3 years but it has been escalating and getting worse to the point that she is spending hundreds of dollars on drugs and neglecting her children and her life at home. Patient says she is not drinking alcohol. Uses marijuana occasionally. Her mood has been depressed and anxious, which is chronic. The drug use has been making things more erratic. She does not sleep well at night. Her appetite has been poor. She had stated earlier in the evening that she was having suicidal thoughts but she tells me now that she really does not want to kill herself but does feel out of control. She denies any psychotic symptoms. She has not been in any kind of detox or substance abuse program in the past.   PAST PSYCHIATRIC HISTORY: Evidently has been treated outpatient by Dr. Tamala Julian for what she calls "depression and borderline bipolar and anxiety". It was diagnosed in the past that she had been on Pristiq. Patient says that she is also prescribed Xanax but she only uses it to come down off the cocaine or she sells it in order to buy other drugs. She denies any past history of suicide attempts. Denies any previous hospitalizations. She says she stopped taking all of her psychiatric  medicines other than the Xanax that she abuses.   SOCIAL HISTORY: Patient lives with her mother and her 22-year-old daughter. Patient is not working and has been trying to get disability. She was married once before and has two older adult children who are not in her custody.   PAST MEDICAL HISTORY: Diagnosis in the past of fibromyalgia. Also has a history of nephritis and acute urinary tract infection that was treated at the hospital. No other clearly identified medical problem.   MEDICATIONS: On admission she only admits taking Xanax about 2 mg at night.   ALLERGIES: No known drug allergies.   REVIEW OF SYSTEMS: Complains of feeling generalized pain all over her body. Feels sad, anxious, upset. Feels out of control. Denies suicidal or homicidal ideation. Denies hallucinations. Denies that she is having nausea, vomiting or diarrhea.   MENTAL STATUS EXAM: Disheveled woman who looks her stated age. Cooperative with the interview initially but became emotionally overwhelmed which compromised her ability to be compliant with the exam at times. Eye contact was intermittent. Psychomotor activity was very fidgety and at one point she began rocking back and forth. Speech is variable in tone. Decreased in total amount. Affect is distraught, tearful and upset. Mood is stated as being upset. Thoughts are not psychotic, generally lucid. Does get overwhelmed with her feelings at times. Not apparently responding to internal stimuli. Denies hallucinations. Denies suicidal or homicidal ideation currently. Judgment and insight questionable given that she is now saying that she thinks she can  go home and do this on her own.   PHYSICAL EXAMINATION:  GENERAL: Patient is flushed all over but has no acute skin lesions.    CURRENT VITAL SIGNS: Temperature 98.5, pulse 107, blood pressure last read as 92/54.   HEENT: Pupils are equal and reactive. Face symmetric.   NECK: Mildly tender. There is diffuse tenderness almost  anywhere you palpate her including down the back.   MUSCULOSKELETAL: Full range of motion at all extremities. Gait normal.   NEUROLOGICAL: Cranial nerves intact and symmetric. Strength and reflexes appear to be grossly intact and symmetric.   LUNGS: Clear with no wheezes.   HEART: Regular rate and rhythm. No extra sounds.   ABDOMEN: Soft, nontender, normal bowel sounds.   ASSESSMENT: 49 year old woman who comes in for opiate detox. Also needs detox from benzodiazepines. Also needs treatment for cocaine abuse. Patient initially was calm but has become increasingly agitated here in the Emergency Room. At one point was talking about suicidal ideation. She has made it clear that she has not been able to stay off of drugs out of the hospital. At this point, it seems reasonable to go ahead and follow through with involuntary admission given the out of control nature of her affect and her drug use.   TREATMENT PLAN: I am going to put her on low dose of Ativan for detox from Xanax and medicines for treatment of detox from opiates. Engage her in groups and activities. Do psychoeducation. Try and get collateral history. Work on treatment planning with an emphasis on further substance abuse treatment.   DIAGNOSIS PRINCIPLE AND PRIMARY:  AXIS I: Polysubstance dependence.   SECONDARY DIAGNOSES:  AXIS I: Depression, not otherwise specified.   AXIS II: Deferred.   AXIS III: Fibromyalgia.   AXIS IV: Moderate to severe from being a single mother, not being able to work, chronic pain.   AXIS V: Functioning at time of admission 30.   ____________________________ Gonzella Lex, MD jtc:cms D: 07/28/2012 18:17:55 ET T: 07/29/2012 06:18:12 ET JOB#: [id number]  cc: Gonzella Lex, MD, <Dictator> Gonzella Lex MD ELECTRONICALLY SIGNED 07/29/2012 9:00

## 2019-02-21 ENCOUNTER — Emergency Department
Admission: EM | Admit: 2019-02-21 | Discharge: 2019-02-21 | Disposition: A | Discharge status: Home / Self Care | Payer: Self-pay | Attending: Emergency Medicine | Admitting: Emergency Medicine

## 2019-02-21 ENCOUNTER — Other Ambulatory Visit: Payer: Self-pay

## 2019-02-21 ENCOUNTER — Emergency Department: Payer: Self-pay

## 2019-02-21 ENCOUNTER — Encounter: Payer: Self-pay | Admitting: Emergency Medicine

## 2019-02-21 DIAGNOSIS — Z79899 Other long term (current) drug therapy: Secondary | ICD-10-CM | POA: Insufficient documentation

## 2019-02-21 DIAGNOSIS — F329 Major depressive disorder, single episode, unspecified: Secondary | ICD-10-CM | POA: Insufficient documentation

## 2019-02-21 DIAGNOSIS — R69 Illness, unspecified: Secondary | ICD-10-CM

## 2019-02-21 DIAGNOSIS — I10 Essential (primary) hypertension: Secondary | ICD-10-CM | POA: Insufficient documentation

## 2019-02-21 DIAGNOSIS — R911 Solitary pulmonary nodule: Secondary | ICD-10-CM | POA: Insufficient documentation

## 2019-02-21 DIAGNOSIS — F17200 Nicotine dependence, unspecified, uncomplicated: Secondary | ICD-10-CM | POA: Insufficient documentation

## 2019-02-21 DIAGNOSIS — R918 Other nonspecific abnormal finding of lung field: Secondary | ICD-10-CM

## 2019-02-21 DIAGNOSIS — J441 Chronic obstructive pulmonary disease with (acute) exacerbation: Secondary | ICD-10-CM | POA: Insufficient documentation

## 2019-02-21 DIAGNOSIS — J111 Influenza due to unidentified influenza virus with other respiratory manifestations: Secondary | ICD-10-CM | POA: Insufficient documentation

## 2019-02-21 HISTORY — DX: Essential (primary) hypertension: I10

## 2019-02-21 LAB — CBC
HEMATOCRIT: 42.2 % (ref 36.0–46.0)
Hemoglobin: 14.5 g/dL (ref 12.0–15.0)
MCH: 33.4 pg (ref 26.0–34.0)
MCHC: 34.4 g/dL (ref 30.0–36.0)
MCV: 97.2 fL (ref 80.0–100.0)
Platelets: 449 10*3/uL — ABNORMAL HIGH (ref 150–400)
RBC: 4.34 MIL/uL (ref 3.87–5.11)
RDW: 12.2 % (ref 11.5–15.5)
WBC: 13.5 10*3/uL — ABNORMAL HIGH (ref 4.0–10.5)
nRBC: 0 % (ref 0.0–0.2)

## 2019-02-21 LAB — BASIC METABOLIC PANEL
Anion gap: 8 (ref 5–15)
BUN: 9 mg/dL (ref 6–20)
CO2: 23 mmol/L (ref 22–32)
Calcium: 9 mg/dL (ref 8.9–10.3)
Chloride: 105 mmol/L (ref 98–111)
Creatinine, Ser: 0.6 mg/dL (ref 0.44–1.00)
GFR calc Af Amer: >60 mL/min (ref >60)
GFR calc non Af Amer: >60 mL/min (ref >60)
GLUCOSE: 123 mg/dL — AB (ref 70–99)
Potassium: 3.4 mmol/L — ABNORMAL LOW (ref 3.5–5.1)
Sodium: 136 mmol/L (ref 135–145)

## 2019-02-21 LAB — TROPONIN I: Troponin I: <0.03 ng/mL (ref <0.03)

## 2019-02-21 MED ORDER — IPRATROPIUM-ALBUTEROL 0.5-2.5 (3) MG/3ML IN SOLN
3.0000 mL | Freq: Once | RESPIRATORY_TRACT | Status: AC
  Administered 2019-02-21: 3 mL via RESPIRATORY_TRACT
  Filled 2019-02-21: qty 3

## 2019-02-21 MED ORDER — OSELTAMIVIR PHOSPHATE 75 MG PO CAPS: 75.0000 mg | ORAL_CAPSULE | Freq: Two times a day (BID) | ORAL | 0 refills | Status: AC

## 2019-02-21 MED ORDER — SODIUM CHLORIDE 0.9% FLUSH: 3.0000 mL | Freq: Once | INTRAVENOUS | Status: DC

## 2019-02-21 MED ORDER — IOHEXOL 300 MG/ML  SOLN
75.0000 mL | Freq: Once | INTRAMUSCULAR | Status: AC | PRN
  Administered 2019-02-21: 75 mL via INTRAVENOUS
  Filled 2019-02-21: qty 75

## 2019-02-21 MED ORDER — PREDNISONE 50 MG PO TABS: 50.0000 mg | ORAL_TABLET | Freq: Every day | ORAL | 0 refills | Status: DC

## 2019-02-21 MED ORDER — METHYLPREDNISOLONE SODIUM SUCC 125 MG IJ SOLR
125.0000 mg | Freq: Once | INTRAMUSCULAR | Status: AC
  Administered 2019-02-21: 125 mg via INTRAVENOUS
  Filled 2019-02-21: qty 2

## 2019-02-21 MED ORDER — ALBUTEROL SULFATE HFA 108 (90 BASE) MCG/ACT IN AERS: 2.0000 | INHALATION_SPRAY | Freq: Four times a day (QID) | RESPIRATORY_TRACT | 2 refills | Status: DC | PRN

## 2019-02-21 NOTE — ED Triage Notes (Signed)
Patient presents to ED via POV from home with c/o chest pain. Patient reports pain is worse when she lays on her right side. Patient describes the pain as a tightness. Patient endorses nausea.

## 2019-02-21 NOTE — ED Provider Notes (Signed)
[facility] Emergency Department Provider Note   ____________________________________________    I have reviewed the triage vital signs and the nursing notes.   HISTORY  Chief Complaint Chest Pain     HPI Amy Ferrell is a 53 y.o. female who presents with complaints of chest tightness, mild shortness of breath, wheezing and body aches.  Patient describes symptoms of worsened over the last 24 hours.  She has had some chills but no fevers.  No nausea or vomiting.  No abdominal pain.  Has not taken anything for this.  Does smoke cigarettes.  No sick contacts, no recent travel  Past Medical History:  Diagnosis Date  . Depression   . GERD (gastroesophageal reflux disease)   . Hypertension   . SVT (supraventricular tachycardia) [facility])     Patient Active Problem List   Diagnosis Date Noted  . TOBACCO USER 06/27/2010  . CLAUDICATION 06/27/2010  . ELEVATED BLOOD PRESSURE WITHOUT DIAGNOSIS OF HYPERTENSION 06/27/2010  . SUPRAVENTRICULAR TACHYCARDIA 06/19/2010  . CHEST PAIN UNSPECIFIED 06/19/2010    Past Surgical History:  Procedure Laterality Date  . appebdectomy    . CESAREAN SECTION     x3   . VESICOVAGINAL FISTULA CLOSURE W/ TAH      Prior to Admission medications   Medication Sig Start Date End Date Taking? Authorizing Provider  acetaminophen (TYLENOL) 325 MG tablet Take 650 mg by mouth every 6 (six) hours as needed.      [provider]  albuterol (PROVENTIL HFA;VENTOLIN HFA) 108 (90 Base) MCG/ACT inhaler Inhale 2 puffs into the lungs every 6 (six) hours as needed for wheezing or shortness of breath. 02/21/19   Lavonia Drafts, MD  desvenlafaxine (PRISTIQ) 100 MG 24 hr tablet Take 100 mg by mouth daily.      [provider]  diltiazem (CARDIZEM) 30 MG tablet Take 30 mg by mouth 4 (four) times daily as needed.      [provider]  diphenhydramine-acetaminophen (TYLENOL PM) 25-500 MG TABS Take 1 tablet by mouth at  bedtime as needed.      [provider]  gabapentin (NEURONTIN) 600 MG tablet Take 600 mg by mouth 3 (three) times daily.      [provider]  omeprazole (PRILOSEC) 40 MG capsule Take 40 mg by mouth 2 (two) times daily.      [provider]  oseltamivir (TAMIFLU) 75 MG capsule Take 1 capsule (75 mg total) by mouth 2 (two) times daily for 5 days. 02/21/19 02/26/19  Lavonia Drafts, MD  predniSONE (DELTASONE) 50 MG tablet Take 1 tablet (50 mg total) by mouth daily with breakfast. 02/21/19   Lavonia Drafts, MD     Allergies Patient has no known allergies.  Family History  Problem Relation Age of Onset  . Coronary artery disease Other        GF  . Hypertension Mother     Social History Social History   Tobacco Use  . Smoking status: Smoker, Current Status Unknown  Substance Use Topics  . Alcohol use: No  . Drug use: No    Review of Systems  Constitutional: As above Eyes: No visual changes.  ENT: No sore throat. Cardiovascular: As above Respiratory: As above Gastrointestinal: No abdominal pain.  No nausea, no vomiting.   Genitourinary: Negative for dysuria. Musculoskeletal: Myalgias Skin: Negative for rash. Neurological: Negative for headaches or weakness   ____________________________________________   PHYSICAL EXAM:  VITAL SIGNS: ED Triage Vitals  Enc Vitals Group  BP 02/21/19 1101 (!) 169/96     Pulse Rate 02/21/19 1101 91     Resp 02/21/19 1101 20     Temp 02/21/19 1101 98.2 F (36.8 C)     Temp Source 02/21/19 1101 Oral     SpO2 02/21/19 1101 97 %     Weight 02/21/19 1058 77.1 kg (170 lb)     Height 02/21/19 1058 1.626 m (5\' 4" )     Head Circumference --      Peak Flow --      Pain Score 02/21/19 1058 5     Pain Loc --      Pain Edu? --      Excl. in [place]? --     Constitutional: Alert and oriented.  Eyes: Conjunctivae are normal.  Head: Atraumatic. Nose: No congestion/rhinnorhea. Mouth/Throat: Mucous membranes are moist.     Neck:  Painless ROM Cardiovascular: Normal rate, regular rhythm. Grossly normal heart sounds.  Good peripheral circulation. Respiratory: Normal respiratory effort.  No retractions.  Bilateral wheezing, mild to moderate Gastrointestinal: Soft and nontender. No distention.    Musculoskeletal: No lower extremity tenderness nor edema.  Warm and well perfused Neurologic:  Normal speech and language. No gross focal neurologic deficits are appreciated.  Skin:  Skin is warm, dry and intact. No rash noted. Psychiatric: Mood and affect are normal. Speech and behavior are normal.  ____________________________________________   LABS (all labs ordered are listed, but only abnormal results are displayed)  Labs Reviewed  BASIC METABOLIC PANEL - Abnormal; Notable for the following components:      Result Value   Potassium 3.4 (*)    Glucose, Bld 123 (*)    All other components within normal limits  CBC - Abnormal; Notable for the following components:   WBC 13.5 (*)    Platelets 449 (*)    All other components within normal limits  TROPONIN I  POC URINE PREG, ED   ____________________________________________  EKG  ED ECG REPORT I, Lavonia Drafts, the attending physician, personally viewed and interpreted this ECG.  Date: 02/21/2019  Rhythm: normal sinus rhythm QRS Axis: normal Intervals: normal ST/T Wave abnormalities: normal Narrative Interpretation: no evidence of acute ischemia  ____________________________________________  RADIOLOGY  Chest x-ray possible opacity CT chest demonstrates multiple nodules, discussed with the patient and the need for follow-up CT scan in 3 to 6 months, she reports she knows about the nodules ____________________________________________   PROCEDURES  Procedure(s) performed: No  Procedures   Critical Care performed: No ____________________________________________   INITIAL IMPRESSION / ASSESSMENT AND PLAN / ED COURSE  Pertinent labs &  imaging results that were available during my care of the patient were reviewed by me and considered in my medical decision making (see chart for details).  Patient presents with shortness of breath likely related to COPD exacerbation given her wheezing, she also describes myalgias and fatigue suspicious for influenza-like illness.  We will treat her in the emergency department with IV Solu-Medrol, duo nebs and reevaluate  Chest x-ray demonstrates possible opacity will obtain CT  ----------------------------------------- 2:46 PM on 02/21/2019 -----------------------------------------  Patient reports her breathing is significantly better, appropriate for discharge at this time with Tamiflu, prednisone, albuterol, strict return precautions discussed    ____________________________________________   FINAL CLINICAL IMPRESSION(S) / ED DIAGNOSES  Final diagnoses:  COPD exacerbation ([place])  Influenza-like illness  Pulmonary nodules        Note:  This document was prepared using Dragon voice recognition software and may include unintentional dictation  errors.   Lavonia Drafts, MD 02/21/19 1447

## 2019-05-28 ENCOUNTER — Other Ambulatory Visit: Payer: Self-pay

## 2019-05-28 ENCOUNTER — Emergency Department
Admission: EM | Admit: 2019-05-28 | Discharge: 2019-05-28 | Disposition: A | Discharge status: Home / Self Care | Payer: Self-pay | Attending: Emergency Medicine | Admitting: Emergency Medicine

## 2019-05-28 DIAGNOSIS — I10 Essential (primary) hypertension: Secondary | ICD-10-CM | POA: Insufficient documentation

## 2019-05-28 DIAGNOSIS — K047 Periapical abscess without sinus: Secondary | ICD-10-CM | POA: Insufficient documentation

## 2019-05-28 DIAGNOSIS — F17200 Nicotine dependence, unspecified, uncomplicated: Secondary | ICD-10-CM | POA: Insufficient documentation

## 2019-05-28 MED ORDER — PENICILLIN V POTASSIUM 500 MG PO TABS: 500.0000 mg | ORAL_TABLET | Freq: Four times a day (QID) | ORAL | 0 refills | Status: AC

## 2019-05-28 MED ORDER — TRAMADOL HCL 50 MG PO TABS: 50.0000 mg | ORAL_TABLET | Freq: Four times a day (QID) | ORAL | 0 refills | Status: DC | PRN

## 2019-05-28 MED ORDER — BUPIVACAINE HCL (PF) 0.5 % IJ SOLN
10.0000 mL | Freq: Once | INTRAMUSCULAR | Status: AC
  Administered 2019-05-28: 10 mL
  Filled 2019-05-28: qty 30

## 2019-05-28 NOTE — ED Provider Notes (Signed)
[facility] Emergency Department Provider Note  ____________________________________________   I have reviewed the triage vital signs and the nursing notes.   HISTORY  Chief Complaint Facial Swelling   History limited by: Not Limited   HPI Amy Ferrell is a 53 y.o. female who presents to the emergency department today because of concern for dental pain and infection. The patient's symptoms started a little over 24 hours ago. Noticed pain and swelling to her left lower jaw. The patient does have history of poor dentition. Denies history of infections in the past. Has appointment with dentist within one week to get teeth pulled. Patient denies any fevers, shortness of breath.     Records reviewed. Per medical record review patient has a history of HTN, GERD.   Past Medical History:  Diagnosis Date  . Depression   . GERD (gastroesophageal reflux disease)   . Hypertension   . SVT (supraventricular tachycardia) [facility])     Patient Active Problem List   Diagnosis Date Noted  . TOBACCO USER 06/27/2010  . CLAUDICATION 06/27/2010  . ELEVATED BLOOD PRESSURE WITHOUT DIAGNOSIS OF HYPERTENSION 06/27/2010  . SUPRAVENTRICULAR TACHYCARDIA 06/19/2010  . CHEST PAIN UNSPECIFIED 06/19/2010    Past Surgical History:  Procedure Laterality Date  . appebdectomy    . CESAREAN SECTION     x3   . VESICOVAGINAL FISTULA CLOSURE W/ TAH      Prior to Admission medications   Medication Sig Start Date End Date Taking? Authorizing Provider  acetaminophen (TYLENOL) 325 MG tablet Take 650 mg by mouth every 6 (six) hours as needed.      [provider]  albuterol (PROVENTIL HFA;VENTOLIN HFA) 108 (90 Base) MCG/ACT inhaler Inhale 2 puffs into the lungs every 6 (six) hours as needed for wheezing or shortness of breath. 02/21/19   Lavonia Drafts, MD  desvenlafaxine (PRISTIQ) 100 MG 24 hr tablet Take 100 mg by mouth daily.      [provider]  diltiazem (CARDIZEM)  30 MG tablet Take 30 mg by mouth 4 (four) times daily as needed.      [provider]  diphenhydramine-acetaminophen (TYLENOL PM) 25-500 MG TABS Take 1 tablet by mouth at bedtime as needed.      [provider]  gabapentin (NEURONTIN) 600 MG tablet Take 600 mg by mouth 3 (three) times daily.      [provider]  omeprazole (PRILOSEC) 40 MG capsule Take 40 mg by mouth 2 (two) times daily.      [provider]  predniSONE (DELTASONE) 50 MG tablet Take 1 tablet (50 mg total) by mouth daily with breakfast. 02/21/19   Lavonia Drafts, MD    Allergies Patient has no known allergies.  Family History  Problem Relation Age of Onset  . Coronary artery disease Other        GF  . Hypertension Mother     Social History Social History   Tobacco Use  . Smoking status: Smoker, Current Status Unknown  Substance Use Topics  . Alcohol use: No  . Drug use: No    Review of Systems Constitutional: No fever/chills Eyes: No visual changes. ENT: Positive for swelling and pain to left lower jaw.  Cardiovascular: Denies chest pain. Respiratory: Denies shortness of breath. Gastrointestinal: No abdominal pain.  No nausea, no vomiting.  No diarrhea.   Genitourinary: Negative for dysuria. Musculoskeletal: Negative for back pain. Skin: Negative for rash. Neurological: Negative for headaches, focal weakness or numbness.  ____________________________________________   PHYSICAL  EXAM:  VITAL SIGNS: ED Triage Vitals  Enc Vitals Group     BP 05/28/19 0305 126/78     Pulse Rate 05/28/19 0305 90     Resp --      Temp 05/28/19 0305 98.1 F (36.7 C)     Temp Source 05/28/19 0305 Oral     SpO2 05/28/19 0305 96 %     Weight 05/28/19 0302 170 lb (77.1 kg)     Height 05/28/19 0302 5\' 4"  (1.626 m)     Head Circumference --      Peak Flow --      Pain Score 05/28/19 0305 8   Constitutional: Alert and oriented.  Eyes: Conjunctivae are normal.  ENT      Head:  Normocephalic and atraumatic.      Nose: No congestion/rhinnorhea.      Mouth/Throat: Poor dentition, tender to palpation of the left lower incisors.       Neck: No stridor. Hematological/Lymphatic/Immunilogical: No cervical lymphadenopathy. Cardiovascular: Normal rate, regular rhythm.  No murmurs, rubs, or gallops.  Respiratory: Normal respiratory effort without tachypnea nor retractions. Breath sounds are clear and equal bilaterally. No wheezes/rales/rhonchi. Gastrointestinal: Soft and non tender. No rebound. No guarding.  Genitourinary: Deferred Musculoskeletal: Normal range of motion in all extremities. No lower extremity edema. Neurologic:  Normal speech and language. No gross focal neurologic deficits are appreciated.  Skin:  Skin is warm, dry and intact. No rash noted. Psychiatric: Mood and affect are normal. Speech and behavior are normal. Patient exhibits appropriate insight and judgment.  ____________________________________________    LABS (pertinent positives/negatives)  None  ____________________________________________   EKG  None  ____________________________________________    RADIOLOGY  None  ____________________________________________   PROCEDURES  Procedures  NERVE BLOCK Performed by: Nance Pear Consent: Verbal consent obtained.  Indication: dental pain Nerve block body site: left lower apical Needle gauge: 25 G Location technique: anatomical landmarks  Local anesthetic: 0.5% bupivicaine  Anesthetic total: 2 ml  Outcome: pain improved Patient tolerance: Patient tolerated the procedure well with no immediate complications.  ____________________________________________   INITIAL IMPRESSION / ASSESSMENT AND PLAN / ED COURSE  Pertinent labs & imaging results that were available during my care of the patient were reviewed by me and considered in my medical decision making (see chart for details).   Patient presented to the emergency  department today because of concerns for dental pain.  On exam patient did have poor dentition and had pain to her lower incisors.  Primarily on the left side.  Patient did get bupivacaine nerve block with improvement of her symptoms.  Will discharge with antibiotics.  Patient already has follow-up appointment with dentistry scheduled.  ____________________________________________   FINAL CLINICAL IMPRESSION(S) / ED DIAGNOSES  Final diagnoses:  Dental infection     Note: This dictation was prepared with Dragon dictation. Any transcriptional errors that result from this process are unintentional     Nance Pear, MD 05/28/19 [phone]

## 2019-05-28 NOTE — ED Triage Notes (Signed)
Reports noticed facial pain and swelling since early Saturday morning, ? Dental abscess.  Swelling noted to left side of face.

## 2019-05-28 NOTE — Discharge Instructions (Addendum)
Please seek medical attention for any high fevers, chest pain, shortness of breath, change in behavior, persistent vomiting, bloody stool or any other new or concerning symptoms.  

## 2019-08-04 ENCOUNTER — Emergency Department: Payer: Self-pay

## 2019-08-04 ENCOUNTER — Encounter: Payer: Self-pay | Admitting: Emergency Medicine

## 2019-08-04 ENCOUNTER — Emergency Department
Admission: EM | Admit: 2019-08-04 | Discharge: 2019-08-04 | Disposition: A | Discharge status: Home / Self Care | Payer: Self-pay | Attending: Emergency Medicine | Admitting: Emergency Medicine

## 2019-08-04 ENCOUNTER — Other Ambulatory Visit: Payer: Self-pay

## 2019-08-04 DIAGNOSIS — Z79899 Other long term (current) drug therapy: Secondary | ICD-10-CM | POA: Insufficient documentation

## 2019-08-04 DIAGNOSIS — I10 Essential (primary) hypertension: Secondary | ICD-10-CM | POA: Insufficient documentation

## 2019-08-04 DIAGNOSIS — R1900 Intra-abdominal and pelvic swelling, mass and lump, unspecified site: Secondary | ICD-10-CM | POA: Insufficient documentation

## 2019-08-04 DIAGNOSIS — R1032 Left lower quadrant pain: Secondary | ICD-10-CM | POA: Insufficient documentation

## 2019-08-04 HISTORY — DX: Disorder of kidney and ureter, unspecified: N28.9

## 2019-08-04 LAB — URINALYSIS, COMPLETE (UACMP) WITH MICROSCOPIC
Bacteria, UA: NONE SEEN
Bilirubin Urine: NEGATIVE
Glucose, UA: NEGATIVE mg/dL
Ketones, ur: NEGATIVE mg/dL
Leukocytes,Ua: NEGATIVE
Nitrite: NEGATIVE
Protein, ur: NEGATIVE mg/dL
Specific Gravity, Urine: 1.013 (ref 1.005–1.030)
pH: 6 (ref 5.0–8.0)

## 2019-08-04 LAB — COMPREHENSIVE METABOLIC PANEL
ALT: 12 U/L (ref 0–44)
AST: 16 U/L (ref 15–41)
Albumin: 3.6 g/dL (ref 3.5–5.0)
Alkaline Phosphatase: 96 U/L (ref 38–126)
Anion gap: 9 (ref 5–15)
BUN: 10 mg/dL (ref 6–20)
CO2: 24 mmol/L (ref 22–32)
Calcium: 9 mg/dL (ref 8.9–10.3)
Chloride: 105 mmol/L (ref 98–111)
Creatinine, Ser: 0.48 mg/dL (ref 0.44–1.00)
GFR calc Af Amer: >60 mL/min (ref >60)
GFR calc non Af Amer: >60 mL/min (ref >60)
Glucose, Bld: 103 mg/dL — ABNORMAL HIGH (ref 70–99)
Potassium: 3.6 mmol/L (ref 3.5–5.1)
Sodium: 138 mmol/L (ref 135–145)
Total Bilirubin: 0.1 mg/dL — ABNORMAL LOW (ref 0.3–1.2)
Total Protein: 7.6 g/dL (ref 6.5–8.1)

## 2019-08-04 LAB — CBC
HCT: 42.1 % (ref 36.0–46.0)
Hemoglobin: 14.6 g/dL (ref 12.0–15.0)
MCH: 33.4 pg (ref 26.0–34.0)
MCHC: 34.7 g/dL (ref 30.0–36.0)
MCV: 96.3 fL (ref 80.0–100.0)
Platelets: 427 10*3/uL — ABNORMAL HIGH (ref 150–400)
RBC: 4.37 MIL/uL (ref 3.87–5.11)
RDW: 12.4 % (ref 11.5–15.5)
WBC: 9.2 10*3/uL (ref 4.0–10.5)
nRBC: 0 % (ref 0.0–0.2)

## 2019-08-04 LAB — PREGNANCY, URINE: Preg Test, Ur: NEGATIVE

## 2019-08-04 MED ORDER — IBUPROFEN 600 MG PO TABS
600.0000 mg | ORAL_TABLET | ORAL | Status: AC
  Administered 2019-08-04: 600 mg via ORAL
  Filled 2019-08-04: qty 1

## 2019-08-04 MED ORDER — HYDROCODONE-ACETAMINOPHEN 5-325 MG PO TABS
2.0000 | ORAL_TABLET | Freq: Once | ORAL | Status: AC
  Administered 2019-08-04: 2 via ORAL
  Filled 2019-08-04: qty 2

## 2019-08-04 MED ORDER — ONDANSETRON 4 MG PO TBDP
4.0000 mg | ORAL_TABLET | Freq: Once | ORAL | Status: AC
  Administered 2019-08-04: 09:00:00 4 mg via ORAL
  Filled 2019-08-04: qty 1

## 2019-08-04 MED ORDER — HYDROCODONE-ACETAMINOPHEN 5-325 MG PO TABS: 1.0000 | ORAL_TABLET | Freq: Four times a day (QID) | ORAL | 0 refills | Status: DC | PRN

## 2019-08-04 NOTE — ED Provider Notes (Signed)
[facility] Emergency Department Provider Note   ____________________________________________   First MD Initiated Contact with Patient 08/04/19 [phone]     (approximate)  I have reviewed the triage vital signs and the nursing notes.   HISTORY  Chief Complaint Flank Pain and Abdominal Pain    HPI Amy Ferrell is a 53 y.o. female experiencing pain discomfort along her left flank for about a week now  Patient reports it is been very uncomfortable, she is been experiencing a pain that seems to come from the left lower back that radiates sometimes towards her left lower groin region.  Previous hysterectomy.  No nausea vomiting.  No fevers or chills.  No difficulty with urination but a little bit of an itchy or burning discomfort when she does urinate.   Was recently diagnosed with herpes, but they have been sexually active together for 7 years now and she is experience any pain or burning in the genitourinary region.  No fevers or chills.  Eating and drinking okay.  Pain described as moderate in the left flank radiates slight towards the left lower quadrant at times  Past Medical History:  Diagnosis Date   Depression    GERD (gastroesophageal reflux disease)    Hypertension    Renal disorder    conjoined kidney   SVT (supraventricular tachycardia) [facility])     Patient Active Problem List   Diagnosis Date Noted   TOBACCO USER 06/27/2010   CLAUDICATION 06/27/2010   ELEVATED BLOOD PRESSURE WITHOUT DIAGNOSIS OF HYPERTENSION 06/27/2010   SUPRAVENTRICULAR TACHYCARDIA 06/19/2010   CHEST PAIN UNSPECIFIED 06/19/2010    Past Surgical History:  Procedure Laterality Date   appebdectomy     CESAREAN SECTION     x3    VESICOVAGINAL FISTULA CLOSURE W/ TAH      Prior to Admission medications   Medication Sig Start Date End Date Taking? Authorizing Provider  acetaminophen (TYLENOL) 325 MG tablet Take 650 mg by mouth every 6 (six) hours as needed.      Yes [provider]  omeprazole (PRILOSEC) 40 MG capsule Take 40 mg by mouth 2 (two) times daily.     Yes [provider]  tiotropium (SPIRIVA) 18 MCG inhalation capsule Place 18 mcg into inhaler and inhale daily.   Yes [provider]  traZODone (DESYREL) 150 MG tablet Take 150 mg by mouth at bedtime as needed. 05/27/15  Yes [provider]  umeclidinium-vilanterol (ANORO ELLIPTA) 62.5-25 MCG/INH AEPB Inhale 1 puff into the lungs daily. 08/04/17  Yes [provider]  HYDROcodone-acetaminophen (NORCO/VICODIN) 5-325 MG tablet Take 1-2 tablets by mouth every 6 (six) hours as needed for moderate pain. 08/04/19   Delman Kitten, MD    Allergies Patient has no known allergies.  Family History  Problem Relation Age of Onset   Coronary artery disease Other        GF   Hypertension Mother     Social History Social History   Tobacco Use   Smoking status: Smoker, Current Status Unknown  Substance Use Topics   Alcohol use: No   Drug use: No    Review of Systems Constitutional: No fever/chills Eyes: No visual changes. ENT: No sore throat. Cardiovascular: Denies chest pain. Respiratory: Denies shortness of breath. Gastrointestinal: See HPI Genitourinary: See HPI Musculoskeletal: Negative for back pain. Skin: Negative for rash. Neurological: Negative for headaches, areas of focal weakness or numbness.    ____________________________________________   PHYSICAL EXAM:  VITAL SIGNS: ED Triage Vitals [08/04/19  0747]  Enc Vitals Group     BP (!) 162/80     Pulse Rate 87     Resp 16     Temp 98.2 F (36.8 C)     Temp Source Oral     SpO2 98 %     Weight 165 lb (74.8 kg)     Height 5\' 4"  (1.626 m)     Head Circumference      Peak Flow      Pain Score 8     Pain Loc      Pain Edu?      Excl. in [place]?     Constitutional: Alert and oriented. Well appearing and in no acute distress. Eyes: Conjunctivae are normal. Head:  Atraumatic. Nose: No congestion/rhinnorhea. Mouth/Throat: Mucous membranes are moist. Neck: No stridor.  Cardiovascular: Normal rate, regular rhythm. Grossly normal heart sounds.  Good peripheral circulation. Respiratory: Normal respiratory effort.  No retractions. Lungs CTAB. Gastrointestinal: Soft and mild left CVA tenderness.  Mild tenderness in the left flank, no focal left lower quadrant or right lower quadrant pain.  No suprapubic tenderness.  No right upper quadrant pain. Musculoskeletal: No lower extremity tenderness nor edema. Neurologic:  Normal speech and language. No gross focal neurologic deficits are appreciated.  Skin:  Skin is warm, dry and intact. No rash noted. Psychiatric: Mood and affect are normal. Speech and behavior are normal.  ____________________________________________   LABS (all labs ordered are listed, but only abnormal results are displayed)  Labs Reviewed  CBC - Abnormal; Notable for the following components:      Result Value   Platelets 427 (*)    All other components within normal limits  COMPREHENSIVE METABOLIC PANEL - Abnormal; Notable for the following components:   Glucose, Bld 103 (*)    Total Bilirubin 0.1 (*)    All other components within normal limits  URINALYSIS, COMPLETE (UACMP) WITH MICROSCOPIC - Abnormal; Notable for the following components:   Color, Urine YELLOW (*)    APPearance CLEAR (*)    Hgb urine dipstick SMALL (*)    All other components within normal limits  URINE CULTURE  PREGNANCY, URINE  OVARIAN MALIGNANCY RISK-ROMA   ____________________________________________  EKG   ____________________________________________  RADIOLOGY  US Transvaginal Non-ob  Result Date: 08/04/2019 CLINICAL DATA:  Left lower quadrant abdominal pain. EXAM: TRANSABDOMINAL AND TRANSVAGINAL ULTRASOUND OF PELVIS DOPPLER ULTRASOUND OF OVARIES TECHNIQUE: Both transabdominal and transvaginal ultrasound examinations of the pelvis were performed.  Transabdominal technique was performed for global imaging of the pelvis including uterus, ovaries, adnexal regions, and pelvic cul-de-sac. It was necessary to proceed with endovaginal exam following the transabdominal exam to visualize the adnexal regions. Color and duplex Doppler ultrasound was utilized to evaluate blood flow to the ovaries. COMPARISON:  CT scan of same day. FINDINGS: Status post hysterectomy. Right ovary Measurements: 3.4 x 3.4 x 1.9 cm = volume: 11 mL. 2.6 cm simple follicular cyst is noted. Left ovary Normal left ovarian tissue is not clearly visualized. Pulsed Doppler evaluation of right ovary demonstrates normal low-resistance arterial and venous waveforms. Other findings Large left adnexal complex mass is noted measuring 13.2 x 6.3 x 4.8 cm, with associated solid component measuring 3.4 x 2.8 x 2.6 cm. It appears to demonstrate layering material within its dependent portion. IMPRESSION: Normal left ovarian tissue is not clearly visualized. 13.2 x 6.3 x 4.8 cm left adnexal complex mass is noted which is predominantly cystic, but contains peripheral solid component measuring 3.4 x 2.8 x  2.6 cm. It does appear to have some degree of layering material within its dependent portion. It is uncertain if this represents potential cystic ovarian neoplasm or other pathology. Further evaluation with MRI with and without gadolinium is recommended. Status post hysterectomy. Right ovary is unremarkable. There is no evidence of right ovarian mass or torsion. Electronically Signed   By: Marijo Conception M.D.   On: 08/04/2019 14:42   US Pelvis Complete  Result Date: 08/04/2019 CLINICAL DATA:  Left lower quadrant abdominal pain. EXAM: TRANSABDOMINAL AND TRANSVAGINAL ULTRASOUND OF PELVIS DOPPLER ULTRASOUND OF OVARIES TECHNIQUE: Both transabdominal and transvaginal ultrasound examinations of the pelvis were performed. Transabdominal technique was performed for global imaging of the pelvis including uterus,  ovaries, adnexal regions, and pelvic cul-de-sac. It was necessary to proceed with endovaginal exam following the transabdominal exam to visualize the adnexal regions. Color and duplex Doppler ultrasound was utilized to evaluate blood flow to the ovaries. COMPARISON:  CT scan of same day. FINDINGS: Status post hysterectomy. Right ovary Measurements: 3.4 x 3.4 x 1.9 cm = volume: 11 mL. 2.6 cm simple follicular cyst is noted. Left ovary Normal left ovarian tissue is not clearly visualized. Pulsed Doppler evaluation of right ovary demonstrates normal low-resistance arterial and venous waveforms. Other findings Large left adnexal complex mass is noted measuring 13.2 x 6.3 x 4.8 cm, with associated solid component measuring 3.4 x 2.8 x 2.6 cm. It appears to demonstrate layering material within its dependent portion. IMPRESSION: Normal left ovarian tissue is not clearly visualized. 13.2 x 6.3 x 4.8 cm left adnexal complex mass is noted which is predominantly cystic, but contains peripheral solid component measuring 3.4 x 2.8 x 2.6 cm. It does appear to have some degree of layering material within its dependent portion. It is uncertain if this represents potential cystic ovarian neoplasm or other pathology. Further evaluation with MRI with and without gadolinium is recommended. Status post hysterectomy. Right ovary is unremarkable. There is no evidence of right ovarian mass or torsion. Electronically Signed   By: Marijo Conception M.D.   On: 08/04/2019 14:42   US Renal  Result Date: 08/04/2019 CLINICAL DATA:  Left flank pain. EXAM: RENAL / URINARY TRACT ULTRASOUND COMPLETE COMPARISON:  None. FINDINGS: Right Kidney: Renal measurements: 11.9 x 5.0 x 6.5 cm = volume: 203 mL. An echogenic focus is seen in the right kidney measuring 6.2 mm. No hydronephrosis. Left Kidney: Renal measurements: 12.1 x 4.9 x 5.9 cm = volume: 183 mL. A 13.9 mm shadowing stone is seen in the left kidney. No hydronephrosis. Bladder: Appears normal  for degree of bladder distention. IMPRESSION: 1. Bilateral nonobstructive renal stones. No hydronephrosis. No other abnormalities. Electronically Signed   By: Dorise Bullion III M.D   On: 08/04/2019 09:31   [place] Art/ven Flow Abd Pelv Doppler  Result Date: 08/04/2019 CLINICAL DATA:  Left lower quadrant abdominal pain. EXAM: TRANSABDOMINAL AND TRANSVAGINAL ULTRASOUND OF PELVIS DOPPLER ULTRASOUND OF OVARIES TECHNIQUE: Both transabdominal and transvaginal ultrasound examinations of the pelvis were performed. Transabdominal technique was performed for global imaging of the pelvis including uterus, ovaries, adnexal regions, and pelvic cul-de-sac. It was necessary to proceed with endovaginal exam following the transabdominal exam to visualize the adnexal regions. Color and duplex Doppler ultrasound was utilized to evaluate blood flow to the ovaries. COMPARISON:  CT scan of same day. FINDINGS: Status post hysterectomy. Right ovary Measurements: 3.4 x 3.4 x 1.9 cm = volume: 11 mL. 2.6 cm simple follicular cyst is noted. Left ovary Normal left  ovarian tissue is not clearly visualized. Pulsed Doppler evaluation of right ovary demonstrates normal low-resistance arterial and venous waveforms. Other findings Large left adnexal complex mass is noted measuring 13.2 x 6.3 x 4.8 cm, with associated solid component measuring 3.4 x 2.8 x 2.6 cm. It appears to demonstrate layering material within its dependent portion. IMPRESSION: Normal left ovarian tissue is not clearly visualized. 13.2 x 6.3 x 4.8 cm left adnexal complex mass is noted which is predominantly cystic, but contains peripheral solid component measuring 3.4 x 2.8 x 2.6 cm. It does appear to have some degree of layering material within its dependent portion. It is uncertain if this represents potential cystic ovarian neoplasm or other pathology. Further evaluation with MRI with and without gadolinium is recommended. Status post hysterectomy. Right ovary is unremarkable.  There is no evidence of right ovarian mass or torsion. Electronically Signed   By: Marijo Conception M.D.   On: 08/04/2019 14:42   Ct Renal Stone Study  Result Date: 08/04/2019 CLINICAL DATA:  Acute left flank pain. EXAM: CT ABDOMEN AND PELVIS WITHOUT CONTRAST TECHNIQUE: Multidetector CT imaging of the abdomen and pelvis was performed following the standard protocol without IV contrast. COMPARISON:  None. FINDINGS: Lower chest: 7 mm subpleural nodule is noted laterally in right middle lobe. Otherwise visualized lung bases are unremarkable. Hepatobiliary: No focal liver abnormality is seen. Status post cholecystectomy. No biliary dilatation. Pancreas: Unremarkable. No pancreatic ductal dilatation or surrounding inflammatory changes. Spleen: Normal in size without focal abnormality. Adrenals/Urinary Tract: Adrenal glands appear normal. Right kidney and ureter are unremarkable. Left renal cortical scarring is noted concerning for prior infection. 1 cm nonobstructive calculus is seen in the upper pole collecting system of left kidney. No hydronephrosis or renal obstruction is noted. Urinary bladder is unremarkable. Stomach/Bowel: The stomach appears normal. There is no evidence of bowel obstruction or inflammation. Patient is status post appendectomy. Vascular/Lymphatic: Atherosclerosis of abdominal aorta is noted without aneurysm formation. No significant adenopathy is noted. Reproductive: Status post hysterectomy. 13.6 by 6.0 cm lobulated fluid density is seen in the left adnexal region with 3.7 x 3.1 cm rounded solid peripheral abnormality. This is concerning for possible ovarian cystic abnormality or lymphocele. Other: No abdominal wall hernia or abnormality. No abdominopelvic ascites. Musculoskeletal: No acute or significant osseous findings. IMPRESSION: Large nonobstructive left renal calculus. No hydronephrosis or renal obstruction is noted. Associated left renal scarring is noted. 13.6 x 6.0 cm lobulated fluid  density seen in the left adnexal region of the pelvis with 3.7 x 3.1 cm rounded solid peripheral abnormality. It is uncertain if this represents cystic ovarian neoplasm or potentially lymphocele. Further evaluation with MRI on nonemergent basis is recommended. 7 mm subpleural nodule seen in right middle lobe. Non-contrast chest CT at 6-12 months is recommended. If the nodule is stable at time of repeat CT, then future CT at 18-24 months (from today's scan) is considered optional for low-risk patients, but is recommended for high-risk patients. This recommendation follows the consensus statement: Guidelines for Management of Incidental Pulmonary Nodules Detected on CT Images: From the Fleischner Society 2017; Radiology 2017; 284:228-243. Aortic Atherosclerosis (ICD10-I70.0). Electronically Signed   By: Marijo Conception M.D.   On: 08/04/2019 10:24    Imaging studies reviewed by me, also discussed with Dr. Amalia Hailey both CT and ultrasound. ____________________________________________   PROCEDURES  Procedure(s) performed: None  Procedures  Critical Care performed: No  ____________________________________________   INITIAL IMPRESSION / ASSESSMENT AND PLAN / ED COURSE  Pertinent labs &  imaging results that were available during my care of the patient were reviewed by me and considered in my medical decision making (see chart for details).   Differential diagnosis includes but is not limited to, abdominal perforation, aortic dissection, cholecystitis, appendicitis, diverticulitis, colitis, esophagitis/gastritis, kidney stone, pyelonephritis, urinary tract infection, aortic aneurysm. All are considered in decision and treatment plan. Based upon the patient's presentation and risk factors, I have reviewing urinalysis and lab work.  Upon completion of this, I do not see a acute cause for left flank pain.  Urinalysis reassuring but she does report some urinary discomfort and left flank pain, urine culture  sent.  Renal ultrasound nonrevealing although she does have small stones noted in the kidneys without hydronephrosis.  Discussed with the patient, we will proceed with CT scan stone protocol to further evaluate see if she may have etiology for pain identified on CT.  Does not describe a pain that would suggest ischemia, dissection, aneurysm, or acute vascular abnormality.  No obvious infectious symptoms and slight dysuria     Clinical Course as of Aug 03 1522  Fri Aug 04, 2019  1003 Offered to perform form genitourinary examination, but patient reports pain is in her left flank area, does not wish to have a genital examination done.  Discussed her husband's diagnosis of herpes but she reports she is not having symptoms down there in the genitourinary region and does not wish to have a vaginal exam at this time.  I think this is reasonable.   [MQ]  44 Page Dr. Amalia Hailey of gynecology to evaluate CT findings, discussed consideration for further imaging studies and clinical history.   [MQ]  1424 Pain much better, awaiting ultrasound result.   [MQ]  44 Dr. Amalia Hailey advises discharge with close follow-up with his clinic. There is concern may represent ovarian cancer, but otherwise may be other cause as well. Dr. Amalia Hailey can see patient later this coming week, at which point ROMA should also be available.    [MQ]    Clinical Course User Index [MQ] Delman Kitten, MD   ----------------------------------------- 3:23 PM on 08/04/2019 -----------------------------------------  I will prescribe the patient a narcotic pain medicine due to their condition which I anticipate will cause at least moderate pain short term. I discussed with the patient safe use of narcotic pain medicines, and that they are not to drive, work in dangerous areas, or ever take more than prescribed (no more than 1 pill every 6 hours). We discussed that this is the type of medication that can be  overdosed on and the risks of this type of  medicine. Patient is very agreeable to only use as prescribed and to never use more than prescribed.  Patient pain well controlled with hydrocodone and ibuprofen.  As of constructive careful follow-up plan the patient will follow up with Dr. Amalia Hailey Thursday with the understanding that further evaluation is needed to exclude causes such as ovarian cancer or other unusual tumors.  Patient is comfortable with this plan.  Return precautions and treatment recommendations and follow-up discussed with the patient who is agreeable with the plan.   ____________________________________________   FINAL CLINICAL IMPRESSION(S) / ED DIAGNOSES  Final diagnoses:  Pelvic mass        Note:  This document was prepared using Dragon voice recognition software and may include unintentional dictation errors       Delman Kitten, MD 08/04/19 1524

## 2019-08-04 NOTE — ED Triage Notes (Signed)
Patient presents to the ED with left flank pain x 1 week that is now radiating into left lower quadrant.  Patient reports itching and burning with urination.  Patient also states her husband recently was diagnosed with herpes.  Patient is in no obvious distress at this time.  Reports some nausea.  Denies vomiting and diarrhea.

## 2019-08-04 NOTE — Discharge Instructions (Addendum)
Call today to schedule appointment Thursday with Dr. Amalia Hailey for follow-up.  Please return to the emergency room right away if you are to develop a fever, severe nausea, your pain becomes severe or worsens, you are unable to keep food down, begin vomiting any dark or bloody fluid, you develop any dark or bloody stools, feel dehydrated, or other new concerns or symptoms arise.

## 2019-08-05 LAB — URINE CULTURE: Special Requests: NORMAL

## 2019-08-10 ENCOUNTER — Other Ambulatory Visit: Payer: Self-pay

## 2019-08-10 ENCOUNTER — Encounter: Payer: Self-pay | Admitting: Obstetrics and Gynecology

## 2019-08-10 ENCOUNTER — Ambulatory Visit (INDEPENDENT_AMBULATORY_CARE_PROVIDER_SITE_OTHER): Payer: Self-pay | Admitting: Obstetrics and Gynecology

## 2019-08-10 VITALS — BP 132/79 | HR 94 | Ht 64.0 in | Wt 173.0 lb

## 2019-08-10 DIAGNOSIS — R1900 Intra-abdominal and pelvic swelling, mass and lump, unspecified site: Secondary | ICD-10-CM

## 2019-08-10 DIAGNOSIS — R102 Pelvic and perineal pain: Secondary | ICD-10-CM

## 2019-08-10 NOTE — Progress Notes (Signed)
HPI:      [name] is a 53 y.o. [phone] who LMP was No LMP recorded. Patient has had a hysterectomy.  Subjective:   She presents today after being seen in the emergency department for left lower quadrant pain.  She says the pain has been present for several weeks but has gotten worse lately. She underwent an extensive work-up in the ED including CAT scan and ultrasound.  She was found to have a large left-sided ovarian mass. 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 She states that her mother had "ovarian cancer" but died much later in life from other causes. She does not think she is yet in menopause that she has had no vaginal dryness hot flashes or other signs or symptoms. She continues to experience moderate pain but is able to function.    Hx: The following portions of the patient's history were reviewed and updated as appropriate:             She  has a past medical history of Depression, GERD (gastroesophageal reflux disease), Hypertension, Renal disorder, and SVT (supraventricular tachycardia) ([place]). She does not have any pertinent problems on file. She  has a past surgical history that includes appebdectomy; Vesicovaginal fistula closure w/ TAH; and Cesarean section. Her family history includes Coronary artery disease in an other family member; Hypertension in her mother; Ovarian cancer in her mother. She  reports that she has been smoking. She has never used smokeless tobacco. She reports that she does not drink alcohol or use drugs. She has a current medication list which includes the following prescription(s): acetaminophen, hydrocodone-acetaminophen, omeprazole, tiotropium, trazodone, and umeclidinium-vilanterol. She has No Known Allergies.       Review of Systems:  Review of Systems  Constitutional: Denied constitutional symptoms, night sweats,  recent illness, fatigue, fever, insomnia and weight loss.  Eyes: Denied eye symptoms, eye pain, photophobia, vision change and visual disturbance.  Ears/Nose/Throat/Neck: Denied ear, nose, throat or neck symptoms, hearing loss, nasal discharge, sinus congestion and sore throat.  Cardiovascular: Denied cardiovascular symptoms, arrhythmia, chest pain/pressure, edema, exercise intolerance, orthopnea and palpitations.  Respiratory: Denied pulmonary symptoms, asthma, pleuritic pain, productive sputum, cough, dyspnea and wheezing.  Gastrointestinal: Denied, gastro-esophageal reflux, melena, nausea and vomiting.  Genitourinary: See HPI for additional information.  Musculoskeletal: Denied musculoskeletal symptoms, stiffness, swelling, muscle weakness and myalgia.  Dermatologic: Denied dermatology symptoms, rash and scar.  Neurologic: Denied neurology symptoms, dizziness, headache, neck pain and syncope.  Psychiatric: Denied psychiatric symptoms, anxiety and depression.  Endocrine: Denied endocrine symptoms including hot flashes and night sweats.   Meds:   Current Outpatient Medications on File Prior to Visit  Medication Sig Dispense Refill  . acetaminophen (TYLENOL) 325 MG tablet Take 650 mg by mouth every 6 (six) hours as needed.      Marland Kitchen HYDROcodone-acetaminophen (NORCO/VICODIN) 5-325 MG tablet Take 1-2 tablets by mouth every 6 (six) hours as needed for moderate pain. 16 tablet 0  . omeprazole (PRILOSEC) 40 MG capsule Take 40 mg by mouth 2 (two) times daily.      Marland Kitchen tiotropium (SPIRIVA) 18 MCG inhalation capsule Place 18 mcg into inhaler and inhale daily.    . traZODone (DESYREL) 150 MG tablet Take 150 mg by mouth at bedtime as needed.    . umeclidinium-vilanterol (ANORO ELLIPTA) 62.5-25 MCG/INH AEPB Inhale 1 puff into the lungs daily.  No current facility-administered medications on file prior to visit.     Objective:     Vitals:   08/10/19 1513  BP: 132/79  Pulse: 94               Ultrasound CT and lab work results reviewed from her emergency department visit.  I have discussed these directly with the patient  Assessment:    G3P3003 Patient Active Problem List   Diagnosis Date Noted  . TOBACCO USER 06/27/2010  . CLAUDICATION 06/27/2010  . ELEVATED BLOOD PRESSURE WITHOUT DIAGNOSIS OF HYPERTENSION 06/27/2010  . SUPRAVENTRICULAR TACHYCARDIA 06/19/2010  . CHEST PAIN UNSPECIFIED 06/19/2010     1. Pelvic mass   2. Pelvic pain in female     Complex pelvic mass possibly ovarian-left-sided.   Plan:            1.  I ordered a ROMA test in the emergency department but somehow this was canceled.  I have reordered this and had it drawn today.  Expect results early next week.  Based on this test will consider further imaging question MRI as suggested with ultrasound versus referral to GYN oncology as needed.  I have discussed this in detail with the patient.  Differential diagnosis also discussed. Orders Orders Placed This Encounter  Procedures  . Ovarian Malignancy Risk-ROMA    No orders of the defined types were placed in this encounter.     F/U  No follow-ups on file. I spent 33 minutes involved in the care of this patient of which greater than 50% was spent discussing reviewing her emergency department visit, findings at CT and ultrasound, differential diagnosis in detail, possible treatment options, use of theROMA test.  All questions answered.  Finis Bud, M.D. 08/10/2019 3:59 PM

## 2019-08-12 LAB — PREMENOPAUSAL INTERP: HIGH

## 2019-08-12 LAB — OVARIAN MALIGNANCY RISK-ROMA
Cancer Antigen (CA) 125: 13.3 U/mL (ref 0.0–38.1)
HE4: 81.6 pmol/L (ref 0.0–105.2)
Postmenopausal ROMA: 1.66
Premenopausal ROMA: 2.04 — ABNORMAL HIGH

## 2019-08-12 LAB — POSTMENOPAUSAL INTERP: LOW

## 2019-08-15 ENCOUNTER — Telehealth: Payer: Self-pay | Admitting: Obstetrics and Gynecology

## 2019-08-15 NOTE — Telephone Encounter (Signed)
Please advise on results

## 2019-08-15 NOTE — Telephone Encounter (Signed)
The patient called and stated that she needs to speak with her nurse in regards to getting an update on the status of her results. The pt is request if a call back if the results are back. Please advise.

## 2019-08-16 NOTE — Telephone Encounter (Signed)
The patient called and stated that she did not receive a call back in regards to the message that was sent back yesterday. I informed the pt that a nurse reviewed her message and routed it to Dr. Amalia Hailey. Pt voiced understanding and requested a call back today if possible for an update on the status/reassurance. Please advise.

## 2019-08-16 NOTE — Telephone Encounter (Signed)
LM for patient to return call. Patient needs to schedule lab appointment for another lab. Dr. Amalia Hailey stated that he will determine treatment after the other lab is drawn.

## 2019-08-18 NOTE — Telephone Encounter (Signed)
Patient called back. I have scheduled her for a lab appointment.

## 2019-08-21 ENCOUNTER — Other Ambulatory Visit: Payer: Self-pay

## 2019-08-21 DIAGNOSIS — R102 Pelvic and perineal pain: Secondary | ICD-10-CM

## 2019-08-22 LAB — FOLLICLE STIMULATING HORMONE: FSH: 40.3 m[IU]/mL

## 2019-09-06 ENCOUNTER — Other Ambulatory Visit: Payer: Self-pay

## 2019-09-06 ENCOUNTER — Ambulatory Visit (INDEPENDENT_AMBULATORY_CARE_PROVIDER_SITE_OTHER): Payer: Self-pay | Admitting: Obstetrics and Gynecology

## 2019-09-06 ENCOUNTER — Encounter: Payer: Self-pay | Admitting: Obstetrics and Gynecology

## 2019-09-06 VITALS — BP 158/89 | HR 93 | Ht 64.0 in | Wt 170.8 lb

## 2019-09-06 DIAGNOSIS — R1900 Intra-abdominal and pelvic swelling, mass and lump, unspecified site: Secondary | ICD-10-CM

## 2019-09-06 NOTE — Progress Notes (Signed)
Patient comes in today to go over results.

## 2019-09-06 NOTE — Progress Notes (Signed)
HPI:      [name] is a 53 y.o. [phone] who LMP was No LMP recorded. Patient has had a hysterectomy.  Subjective:   She presents today to discuss her findings and lab work.  She has a complex ovarian cyst previously noted.  Her Roma score was borderline for menopause versus not menopause.  A subsequent FSH showed the patient to be menopausal which made her Roma low risk for malignancy.  Just to be complete I discussed this case with Dr. Fransisca Connors who reviewed the image findings and the Roma scores and suggested that this would be appropriate to approach laparoscopically and that GYN oncology did not need to be involved. The patient is not having much pelvic pain and would like to delay her surgery until early November because of insurance and time off from work reasons.    Hx: The following portions of the patient's history were reviewed and updated as appropriate:             She  has a past medical history of Depression, GERD (gastroesophageal reflux disease), Hypertension, Renal disorder, and SVT (supraventricular tachycardia) ([place]). She does not have any pertinent problems on file. She  has a past surgical history that includes appebdectomy; Vesicovaginal fistula closure w/ TAH; and Cesarean section. Her family history includes Coronary artery disease in an other family member; Hypertension in her mother; Ovarian cancer in her mother. She  reports that she has been smoking. She has never used smokeless tobacco. She reports that she does not drink alcohol or use drugs. She has a current medication list which includes the following prescription(s): acetaminophen, hydrocodone-acetaminophen, omeprazole, tiotropium, trazodone, and umeclidinium-vilanterol. She has No Known Allergies.       Review of Systems:  Review of Systems  Constitutional: Denied constitutional symptoms, night sweats, recent illness, fatigue, fever, insomnia and weight loss.  Eyes: Denied eye symptoms, eye pain,  photophobia, vision change and visual disturbance.  Ears/Nose/Throat/Neck: Denied ear, nose, throat or neck symptoms, hearing loss, nasal discharge, sinus congestion and sore throat.  Cardiovascular: Denied cardiovascular symptoms, arrhythmia, chest pain/pressure, edema, exercise intolerance, orthopnea and palpitations.  Respiratory: Denied pulmonary symptoms, asthma, pleuritic pain, productive sputum, cough, dyspnea and wheezing.  Gastrointestinal: Denied, gastro-esophageal reflux, melena, nausea and vomiting.  Genitourinary: Denied genitourinary symptoms including symptomatic vaginal discharge, pelvic relaxation issues, and urinary complaints.  Musculoskeletal: Denied musculoskeletal symptoms, stiffness, swelling, muscle weakness and myalgia.  Dermatologic: Denied dermatology symptoms, rash and scar.  Neurologic: Denied neurology symptoms, dizziness, headache, neck pain and syncope.  Psychiatric: Denied psychiatric symptoms, anxiety and depression.  Endocrine: Denied endocrine symptoms including hot flashes and night sweats.   Meds:   Current Outpatient Medications on File Prior to Visit  Medication Sig Dispense Refill  . acetaminophen (TYLENOL) 325 MG tablet Take 650 mg by mouth every 6 (six) hours as needed.      Marland Kitchen HYDROcodone-acetaminophen (NORCO/VICODIN) 5-325 MG tablet Take 1-2 tablets by mouth every 6 (six) hours as needed for moderate pain. 16 tablet 0  . omeprazole (PRILOSEC) 40 MG capsule Take 40 mg by mouth 2 (two) times daily.      Marland Kitchen tiotropium (SPIRIVA) 18 MCG inhalation capsule Place 18 mcg into inhaler and inhale daily.    . traZODone (DESYREL) 150 MG tablet Take 150 mg by mouth at bedtime as needed.    . umeclidinium-vilanterol (ANORO ELLIPTA) 62.5-25 MCG/INH AEPB Inhale 1 puff into the lungs daily.     No current facility-administered medications on file prior to  visit.     Objective:     Vitals:   09/06/19 1543  BP: (!) 158/89  Pulse: 93              Image and  lab findings reviewed in detail with the patient  Assessment:    G3P3003 Patient Active Problem List   Diagnosis Date Noted  . TOBACCO USER 06/27/2010  . CLAUDICATION 06/27/2010  . ELEVATED BLOOD PRESSURE WITHOUT DIAGNOSIS OF HYPERTENSION 06/27/2010  . SUPRAVENTRICULAR TACHYCARDIA 06/19/2010  . CHEST PAIN UNSPECIFIED 06/19/2010     1. Pelvic mass     Likely benign based on Roma scores and appearance.   Plan:            1.  Plan for exploratory laparoscopy with oophorectomy and cyst removal. I have discussed the surgery in detail with the patient and we have reviewed the rationale for laparoscopic removal and the finding of low risk for malignancy condition.  All of her questions were answered. Orders No orders of the defined types were placed in this encounter.   No orders of the defined types were placed in this encounter.     F/U  Return in about 3 weeks (around 09/27/2019). I spent 20 minutes involved in the care of this patient of which greater than 50% was spent discussing image findings and lab results and the significance of this.  Please see above all questions answered.  Finis Bud, M.D. 09/06/2019 4:38 PM

## 2019-09-26 ENCOUNTER — Encounter: Payer: Self-pay | Admitting: Emergency Medicine

## 2019-09-26 ENCOUNTER — Other Ambulatory Visit: Payer: Self-pay

## 2019-09-26 ENCOUNTER — Emergency Department
Admission: EM | Admit: 2019-09-26 | Discharge: 2019-09-26 | Disposition: A | Discharge status: Home / Self Care | Payer: Managed Care, Other (non HMO) | Attending: Emergency Medicine | Admitting: Emergency Medicine

## 2019-09-26 DIAGNOSIS — I10 Essential (primary) hypertension: Secondary | ICD-10-CM | POA: Insufficient documentation

## 2019-09-26 DIAGNOSIS — F17210 Nicotine dependence, cigarettes, uncomplicated: Secondary | ICD-10-CM | POA: Insufficient documentation

## 2019-09-26 DIAGNOSIS — N390 Urinary tract infection, site not specified: Secondary | ICD-10-CM | POA: Insufficient documentation

## 2019-09-26 DIAGNOSIS — R1030 Lower abdominal pain, unspecified: Secondary | ICD-10-CM | POA: Diagnosis present

## 2019-09-26 HISTORY — DX: Neoplasm of unspecified behavior of other genitourinary organ: D49.59

## 2019-09-26 LAB — URINALYSIS, COMPLETE (UACMP) WITH MICROSCOPIC
Bilirubin Urine: NEGATIVE
Glucose, UA: NEGATIVE mg/dL
Ketones, ur: NEGATIVE mg/dL
Nitrite: NEGATIVE
Protein, ur: NEGATIVE mg/dL
Specific Gravity, Urine: 1.005 (ref 1.005–1.030)
WBC, UA: >50 WBC/hpf — ABNORMAL HIGH (ref 0–5)
pH: 7 (ref 5.0–8.0)

## 2019-09-26 MED ORDER — CEPHALEXIN 500 MG PO CAPS
500.0000 mg | ORAL_CAPSULE | Freq: Once | ORAL | Status: AC
  Administered 2019-09-26: 500 mg via ORAL
  Filled 2019-09-26: qty 1

## 2019-09-26 MED ORDER — CEPHALEXIN 500 MG PO CAPS: 500.0000 mg | ORAL_CAPSULE | Freq: Two times a day (BID) | ORAL | 0 refills | Status: DC

## 2019-09-26 MED ORDER — KETOROLAC TROMETHAMINE 30 MG/ML IJ SOLN
30.0000 mg | Freq: Once | INTRAMUSCULAR | Status: AC
  Administered 2019-09-26: 11:00:00 30 mg via INTRAMUSCULAR
  Filled 2019-09-26: qty 1

## 2019-09-26 NOTE — ED Provider Notes (Signed)
[facility] Emergency Department Provider Note   ____________________________________________    I have reviewed the triage vital signs and the nursing notes.   HISTORY  Chief Complaint Abdominal Pain and Urinary Retention     HPI Amy Ferrell is a 53 y.o. female who presents with complaints of lower abdominal pain, discomfort with urination.  Patient reports that she has been diagnosed with ovarian cyst/tumor in the last 2 months, is working to get surgery scheduled but had some difficulties with insurance.  She reports she called GYN schedule yesterday and had some difficulty.  She has seen Dr. Amalia Hailey for this.  She reports over the last several days she has had discomfort with urination and sometimes feels like it is difficult to urinate.  She denies fevers or chills.  No back pain.  She does have some discomfort in the area of the cyst which she has had for the last 2 months  Past Medical History:  Diagnosis Date  . Depression   . GERD (gastroesophageal reflux disease)   . Hypertension   . Ovarian tumor   . Renal disorder    conjoined kidney  . SVT (supraventricular tachycardia) [facility])     Patient Active Problem List   Diagnosis Date Noted  . TOBACCO USER 06/27/2010  . CLAUDICATION 06/27/2010  . ELEVATED BLOOD PRESSURE WITHOUT DIAGNOSIS OF HYPERTENSION 06/27/2010  . SUPRAVENTRICULAR TACHYCARDIA 06/19/2010  . CHEST PAIN UNSPECIFIED 06/19/2010    Past Surgical History:  Procedure Laterality Date  . appebdectomy    . CESAREAN SECTION     x3   . VESICOVAGINAL FISTULA CLOSURE W/ TAH      Prior to Admission medications   Medication Sig Start Date End Date Taking? Authorizing Provider  acetaminophen (TYLENOL) 325 MG tablet Take 650 mg by mouth every 6 (six) hours as needed.      [provider]  cephALEXin (KEFLEX) 500 MG capsule Take 1 capsule (500 mg total) by mouth 2 (two) times daily. 09/26/19   Lavonia Drafts, MD   HYDROcodone-acetaminophen (NORCO/VICODIN) 5-325 MG tablet Take 1-2 tablets by mouth every 6 (six) hours as needed for moderate pain. 08/04/19   Delman Kitten, MD  omeprazole (PRILOSEC) 40 MG capsule Take 40 mg by mouth 2 (two) times daily.      [provider]  tiotropium (SPIRIVA) 18 MCG inhalation capsule Place 18 mcg into inhaler and inhale daily.    [provider]  traZODone (DESYREL) 150 MG tablet Take 150 mg by mouth at bedtime as needed. 05/27/15   [provider]  umeclidinium-vilanterol (ANORO ELLIPTA) 62.5-25 MCG/INH AEPB Inhale 1 puff into the lungs daily. 08/04/17   [provider]     Allergies Patient has no known allergies.  Family History  Problem Relation Age of Onset  . Coronary artery disease Other        GF  . Hypertension Mother   . Ovarian cancer Mother     Social History Social History   Tobacco Use  . Smoking status: Smoker, Current Status Unknown  . Smokeless tobacco: Never Used  Substance Use Topics  . Alcohol use: No  . Drug use: No    Review of Systems  Constitutional: No fever/chills Eyes: No visual changes.  ENT: No sore throat. Cardiovascular: Denies chest pain. Respiratory: Denies shortness of breath. Gastrointestinal: As above Genitourinary: As above Musculoskeletal: Negative for back pain. Skin: Negative for rash. Neurological: Negative for headaches or weakness   ____________________________________________   PHYSICAL  EXAM:  VITAL SIGNS: ED Triage Vitals  Enc Vitals Group     BP 09/26/19 0835 (!) 153/87     Pulse Rate 09/26/19 0835 88     Resp 09/26/19 0835 16     Temp 09/26/19 0835 98.3 F (36.8 C)     Temp Source 09/26/19 0835 Oral     SpO2 09/26/19 0835 97 %     Weight 09/26/19 0832 77.1 kg (170 lb)     Height 09/26/19 0832 1.626 m (5\' 4" )     Head Circumference --      Peak Flow --      Pain Score 09/26/19 0832 8     Pain Loc --      Pain Edu? --      Excl. in [place]? --      Constitutional: Alert and oriented.  Nose: No congestion/rhinnorhea. Mouth/Throat: Mucous membranes are moist.    Cardiovascular: Normal rate, regular rhythm. Grossly normal heart sounds.  Good peripheral circulation. Respiratory: Normal respiratory effort.  No retractions. Lungs CTAB. Gastrointestinal: Mild tenderness suprapubically. No distention.  No CVA tenderness.  Reassuring exam Genitourinary: deferred Musculoskeletal:   Warm and well perfused Neurologic:  Normal speech and language. No gross focal neurologic deficits are appreciated.  Skin:  Skin is warm, dry and intact. No rash noted. Psychiatric: Mood and affect are normal. Speech and behavior are normal.  ____________________________________________   LABS (all labs ordered are listed, but only abnormal results are displayed)  Labs Reviewed  URINALYSIS, COMPLETE (UACMP) WITH MICROSCOPIC - Abnormal; Notable for the following components:      Result Value   Color, Urine STRAW (*)    APPearance CLEAR (*)    Hgb urine dipstick SMALL (*)    Leukocytes,Ua SMALL (*)    WBC, UA >50 (*)    Bacteria, UA RARE (*)    All other components within normal limits  URINE CULTURE   ____________________________________________  EKG  None ____________________________________________  RADIOLOGY  None ____________________________________________   PROCEDURES  Procedure(s) performed: No  Procedures   Critical Care performed: No ____________________________________________   INITIAL IMPRESSION / ASSESSMENT AND PLAN / ED COURSE  Pertinent labs & imaging results that were available during my care of the patient were reviewed by me and considered in my medical decision making (see chart for details).  Patient presents with symptoms suspicious for urinary tract infection, pending urinalysis.  Will treat with IM Toradol for her flank pain related to her cyst.  Urinalysis consistent with UTI, will treat with Keflex, culture  sent    ____________________________________________   FINAL CLINICAL IMPRESSION(S) / ED DIAGNOSES  Final diagnoses:  Lower urinary tract infectious disease        Note:  This document was prepared using Dragon voice recognition software and may include unintentional dictation errors.   Lavonia Drafts, MD 09/26/19 1106

## 2019-09-26 NOTE — ED Triage Notes (Signed)
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 Pt reports has been trying to schedule the surgery but has not been successful. Pt reports now she cannot urinate completely and when she does it has a foul smell to it. Pt concerned the tumor is preventing her from emptying her bladder and now she has discomfort in her lower abd.

## 2019-09-26 NOTE — ED Notes (Signed)
Pt notified of need of urine sample, states she is unable to void at this time. Call light within reach. Bladder scan shows 93 mL of urine retained.

## 2019-09-27 ENCOUNTER — Other Ambulatory Visit: Payer: Self-pay

## 2019-09-27 ENCOUNTER — Ambulatory Visit (INDEPENDENT_AMBULATORY_CARE_PROVIDER_SITE_OTHER): Payer: Managed Care, Other (non HMO) | Admitting: Obstetrics and Gynecology

## 2019-09-27 ENCOUNTER — Encounter: Payer: Self-pay | Admitting: Obstetrics and Gynecology

## 2019-09-27 VITALS — BP 153/86 | HR 97 | Ht 64.0 in | Wt 168.1 lb

## 2019-09-27 DIAGNOSIS — R1900 Intra-abdominal and pelvic swelling, mass and lump, unspecified site: Secondary | ICD-10-CM | POA: Diagnosis not present

## 2019-09-27 DIAGNOSIS — Z01818 Encounter for other preprocedural examination: Secondary | ICD-10-CM

## 2019-09-27 DIAGNOSIS — Z0289 Encounter for other administrative examinations: Secondary | ICD-10-CM

## 2019-09-27 NOTE — Progress Notes (Signed)
PRE-OPERATIVE HISTORY AND PHYSICAL EXAM  PCP:  Andrey Campanile, MD Subjective:   HPI:  Amy Ferrell is a 53 y.o. [phone].  No LMP recorded. Patient has had a hysterectomy.  She presents today for a pre-op discussion and PE.  She has the following symptoms: Left-sided ovarian mass with low Roma score.  Pelvic pain.  Review of Systems:   Constitutional: Denied constitutional symptoms, night sweats, recent illness, fatigue, fever, insomnia and weight loss.  Eyes: Denied eye symptoms, eye pain, photophobia, vision change and visual disturbance.  Ears/Nose/Throat/Neck: Denied ear, nose, throat or neck symptoms, hearing loss, nasal discharge, sinus congestion and sore throat.  Cardiovascular: Denied cardiovascular symptoms, arrhythmia, chest pain/pressure, edema, exercise intolerance, orthopnea and palpitations.  Respiratory: Denied pulmonary symptoms, asthma, pleuritic pain, productive sputum, cough, dyspnea and wheezing.  Gastrointestinal: Denied, gastro-esophageal reflux, melena, nausea and vomiting.  Genitourinary: See HPI for additional information.  Musculoskeletal: Denied musculoskeletal symptoms, stiffness, swelling, muscle weakness and myalgia.  Dermatologic: Denied dermatology symptoms, rash and scar.  Neurologic: Denied neurology symptoms, dizziness, headache, neck pain and syncope.  Psychiatric: Denied psychiatric symptoms, anxiety and depression.  Endocrine: Denied endocrine symptoms including hot flashes and night sweats.   OB History  Gravida Para Term Preterm AB Living  3 3 3     3   SAB TAB Ectopic Multiple Live Births          3    # Outcome Date GA Lbr Len/2nd Weight Sex Delivery Anes PTL Lv  3 Term 07/10/03   6 lb (2.722 kg) F CS-High Vert  N LIV  2 Term 07/09/92   6 lb 13 oz (3.09 kg)  CS-LTranv  N LIV  1 Term 12/01/90   7 lb 9 oz (3.43 kg) F CS-High Vert  N LIV     Complications: Placenta Previa    Past Medical History:  Diagnosis Date  . Depression    . GERD (gastroesophageal reflux disease)   . Hypertension   . Ovarian tumor   . Renal disorder    conjoined kidney  . SVT (supraventricular tachycardia) (HCC)     Past Surgical History:  Procedure Laterality Date  . appebdectomy    . CESAREAN SECTION     x3   . VESICOVAGINAL FISTULA CLOSURE W/ TAH        SOCIAL HISTORY: Social History   Tobacco Use  Smoking Status Smoker, Current Status Unknown  Smokeless Tobacco Never Used   Social History   Substance and Sexual Activity  Alcohol Use No   Social History   Substance and Sexual Activity  Drug Use No    Family History  Problem Relation Age of Onset  . Coronary artery disease Other        GF  . Hypertension Mother   . Ovarian cancer Mother     ALLERGIES:  Patient has no known allergies.  MEDS:   Current Outpatient Medications on File Prior to Visit  Medication Sig Dispense Refill  . acetaminophen (TYLENOL) 325 MG tablet Take 650 mg by mouth every 6 (six) hours as needed.      . cephALEXin (KEFLEX) 500 MG capsule Take 1 capsule (500 mg total) by mouth 2 (two) times daily. 14 capsule 0  . omeprazole (PRILOSEC) 40 MG capsule Take 40 mg by mouth 2 (two) times daily.      . traZODone (DESYREL) 150 MG tablet Take 150 mg by mouth at bedtime as needed.    . umeclidinium-vilanterol (  ANORO ELLIPTA) 62.5-25 MCG/INH AEPB Inhale 1 puff into the lungs daily.     No current facility-administered medications on file prior to visit.     No orders of the defined types were placed in this encounter.    Physical examination BP (!) 153/86   Pulse 97   Ht 5\' 4"  (1.626 m)   Wt 168 lb 1.6 oz (76.2 kg)   BMI 28.85 kg/m   General NAD, Conversant  HEENT Atraumatic; Op clear with mmm.  Normo-cephalic. Pupils reactive. Anicteric sclerae  Thyroid/Neck Smooth without nodularity or enlargement. Normal ROM.  Neck Supple.  Skin No rashes, lesions or ulceration. Normal palpated skin turgor. No nodularity.  Breasts: No masses or  discharge.  Symmetric.  No axillary adenopathy.  Lungs: Clear to auscultation.No rales or wheezes. Normal Respiratory effort, no retractions.  Heart: NSR.  No murmurs or rubs appreciated. No periferal edema  Abdomen: Soft.  Non-tender.  No masses.  No HSM. No hernia  Extremities: Moves all appropriately.  Normal ROM for age. No lymphadenopathy.  Neuro: Oriented to PPT.  Normal mood. Normal affect.     Pelvic:   Vulva: Normal appearance.  No lesions.  Vagina: No lesions or abnormalities noted.  Support: Normal pelvic support.  Urethra No masses tenderness or scarring.  Meatus Normal size without lesions or prolapse.  Cervix:  Surgically absent  Anus: Normal exam.  No lesions.  Perineum: Normal exam.  No lesions.        Bimanual   Uterus:  Surgically absent  Adnexae: No masses.  Non-tender to palpation.  Cul-de-sac: Negative for abnormality.   Assessment:   [id number] Patient Active Problem List   Diagnosis Date Noted  . TOBACCO USER 06/27/2010  . CLAUDICATION 06/27/2010  . ELEVATED BLOOD PRESSURE WITHOUT DIAGNOSIS OF HYPERTENSION 06/27/2010  . SUPRAVENTRICULAR TACHYCARDIA 06/19/2010  . CHEST PAIN UNSPECIFIED 06/19/2010    1. Preop examination   2. Pelvic mass     Complex pelvic mass, pelvic pain low Roma score. Plan:   Orders: No orders of the defined types were placed in this encounter.    1.  Exploratory laparoscopy with bilateral oophorectomy.   Pre-op discussions regarding Risks and Benefits of her scheduled surgery.  Oophorectomy The option of Oophorectomy has been discussed with the patient.  Detailed risk/benefits have been reviewed.  The risks discussed include, but are not limited to, hemorrhage, infection, damage to ureter or other internal organ, and Ovarian Remnant Syndrome.  The benefits include a significant decrease in the risk of Ovarian Cancer and in benign Ovarian disease.  The risk of Ovarian CA has been estimated at 1 in 74.  This is a relatively small  risk.  However, should Ovarian CA develop, it is often found late in the course of the disease.  We have also discussed the role of inheritance in the development of Ovarian disease.  Some women, who have close relatives with Ovarian CA, have a higher than 1 in 70 risk of Ovarian CA.  The benefits of Estrogen replacement therapy following Oophorectomy has been stressed.  I have answered all of her questions, and I believe that she has an adequate and informed understanding of the risks and benefits of Oophorectomy. Low Roma score and risk for malignant potential discussed in detail. Patient made aware of gynecologic recommendations.  I spent 29 minutes involved in the care of this patient of which greater than 50% was spent discussing preop risks and benefits, recovery after surgery, COVID testing, ovarian neoplasm risk.  All questions answered  Finis Bud, M.D. 09/27/2019 10:06 AM

## 2019-09-27 NOTE — H&P (Signed)
PRE-OPERATIVE HISTORY AND PHYSICAL EXAM  PCP:  Andrey Campanile, MD Subjective:   HPI:  Amy Ferrell is a 53 y.o. [phone].  No LMP recorded. Patient has had a hysterectomy.  She presents today for a pre-op discussion and PE.  She has the following symptoms: Left-sided ovarian mass with low Roma score.  Pelvic pain.  Review of Systems:   Constitutional: Denied constitutional symptoms, night sweats, recent illness, fatigue, fever, insomnia and weight loss.  Eyes: Denied eye symptoms, eye pain, photophobia, vision change and visual disturbance.  Ears/Nose/Throat/Neck: Denied ear, nose, throat or neck symptoms, hearing loss, nasal discharge, sinus congestion and sore throat.  Cardiovascular: Denied cardiovascular symptoms, arrhythmia, chest pain/pressure, edema, exercise intolerance, orthopnea and palpitations.  Respiratory: Denied pulmonary symptoms, asthma, pleuritic pain, productive sputum, cough, dyspnea and wheezing.  Gastrointestinal: Denied, gastro-esophageal reflux, melena, nausea and vomiting.  Genitourinary: See HPI for additional information.  Musculoskeletal: Denied musculoskeletal symptoms, stiffness, swelling, muscle weakness and myalgia.  Dermatologic: Denied dermatology symptoms, rash and scar.  Neurologic: Denied neurology symptoms, dizziness, headache, neck pain and syncope.  Psychiatric: Denied psychiatric symptoms, anxiety and depression.  Endocrine: Denied endocrine symptoms including hot flashes and night sweats.   OB History  Gravida Para Term Preterm AB Living  3 3 3     3   SAB TAB Ectopic Multiple Live Births          3    # Outcome Date GA Lbr Len/2nd Weight Sex Delivery Anes PTL Lv  3 Term 07/10/03   6 lb (2.722 kg) F CS-High Vert  N LIV  2 Term 07/09/92   6 lb 13 oz (3.09 kg)  CS-LTranv  N LIV  1 Term 12/01/90   7 lb 9 oz (3.43 kg) F CS-High Vert  N LIV     Complications: Placenta Previa    Past Medical History:  Diagnosis Date  . Depression    . GERD (gastroesophageal reflux disease)   . Hypertension   . Ovarian tumor   . Renal disorder    conjoined kidney  . SVT (supraventricular tachycardia) (HCC)     Past Surgical History:  Procedure Laterality Date  . appebdectomy    . CESAREAN SECTION     x3   . VESICOVAGINAL FISTULA CLOSURE W/ TAH        SOCIAL HISTORY: Social History   Tobacco Use  Smoking Status Smoker, Current Status Unknown  Smokeless Tobacco Never Used   Social History   Substance and Sexual Activity  Alcohol Use No   Social History   Substance and Sexual Activity  Drug Use No    Family History  Problem Relation Age of Onset  . Coronary artery disease Other        GF  . Hypertension Mother   . Ovarian cancer Mother     ALLERGIES:  Patient has no known allergies.  MEDS:   Current Outpatient Medications on File Prior to Visit  Medication Sig Dispense Refill  . acetaminophen (TYLENOL) 325 MG tablet Take 650 mg by mouth every 6 (six) hours as needed.      . cephALEXin (KEFLEX) 500 MG capsule Take 1 capsule (500 mg total) by mouth 2 (two) times daily. 14 capsule 0  . omeprazole (PRILOSEC) 40 MG capsule Take 40 mg by mouth 2 (two) times daily.      . traZODone (DESYREL) 150 MG tablet Take 150 mg by mouth at bedtime as needed.    . umeclidinium-vilanterol (  ANORO ELLIPTA) 62.5-25 MCG/INH AEPB Inhale 1 puff into the lungs daily.     No current facility-administered medications on file prior to visit.     No orders of the defined types were placed in this encounter.    Physical examination BP (!) 153/86   Pulse 97   Ht 5\' 4"  (1.626 m)   Wt 168 lb 1.6 oz (76.2 kg)   BMI 28.85 kg/m   General NAD, Conversant  HEENT Atraumatic; Op clear with mmm.  Normo-cephalic. Pupils reactive. Anicteric sclerae  Thyroid/Neck Smooth without nodularity or enlargement. Normal ROM.  Neck Supple.  Skin No rashes, lesions or ulceration. Normal palpated skin turgor. No nodularity.  Breasts: No masses or  discharge.  Symmetric.  No axillary adenopathy.  Lungs: Clear to auscultation.No rales or wheezes. Normal Respiratory effort, no retractions.  Heart: NSR.  No murmurs or rubs appreciated. No periferal edema  Abdomen: Soft.  Non-tender.  No masses.  No HSM. No hernia  Extremities: Moves all appropriately.  Normal ROM for age. No lymphadenopathy.  Neuro: Oriented to PPT.  Normal mood. Normal affect.     Pelvic:   Vulva: Normal appearance.  No lesions.  Vagina: No lesions or abnormalities noted.  Support: Normal pelvic support.  Urethra No masses tenderness or scarring.  Meatus Normal size without lesions or prolapse.  Cervix:  Surgically absent  Anus: Normal exam.  No lesions.  Perineum: Normal exam.  No lesions.        Bimanual   Uterus:  Surgically absent  Adnexae: No masses.  Non-tender to palpation.  Cul-de-sac: Negative for abnormality.   Assessment:   [id number] Patient Active Problem List   Diagnosis Date Noted  . TOBACCO USER 06/27/2010  . CLAUDICATION 06/27/2010  . ELEVATED BLOOD PRESSURE WITHOUT DIAGNOSIS OF HYPERTENSION 06/27/2010  . SUPRAVENTRICULAR TACHYCARDIA 06/19/2010  . CHEST PAIN UNSPECIFIED 06/19/2010    1. Preop examination   2. Pelvic mass     Complex pelvic mass, pelvic pain low Roma score. Plan:   Orders: No orders of the defined types were placed in this encounter.    1.  Exploratory laparoscopy with bilateral oophorectomy.   Pre-op discussions regarding Risks and Benefits of her scheduled surgery.  Oophorectomy The option of Oophorectomy has been discussed with the patient.  Detailed risk/benefits have been reviewed.  The risks discussed include, but are not limited to, hemorrhage, infection, damage to ureter or other internal organ, and Ovarian Remnant Syndrome.  The benefits include a significant decrease in the risk of Ovarian Cancer and in benign Ovarian disease.  The risk of Ovarian CA has been estimated at 1 in 6.  This is a relatively small  risk.  However, should Ovarian CA develop, it is often found late in the course of the disease.  We have also discussed the role of inheritance in the development of Ovarian disease.  Some women, who have close relatives with Ovarian CA, have a higher than 1 in 70 risk of Ovarian CA.  The benefits of Estrogen replacement therapy following Oophorectomy has been stressed.  I have answered all of her questions, and I believe that she has an adequate and informed understanding of the risks and benefits of Oophorectomy. Low Roma score and risk for malignant potential discussed in detail. Patient made aware of gynecologic recommendations.

## 2019-09-27 NOTE — Progress Notes (Signed)
Patient comes in today for pre op appointment.  

## 2019-09-27 NOTE — H&P (View-Only) (Signed)
PRE-OPERATIVE HISTORY AND PHYSICAL EXAM  PCP:  Andrey Campanile, MD Subjective:   HPI:  Amy Ferrell is a 53 y.o. [phone].  No LMP recorded. Patient has had a hysterectomy.  She presents today for a pre-op discussion and PE.  She has the following symptoms: Left-sided ovarian mass with low Roma score.  Pelvic pain.  Review of Systems:   Constitutional: Denied constitutional symptoms, night sweats, recent illness, fatigue, fever, insomnia and weight loss.  Eyes: Denied eye symptoms, eye pain, photophobia, vision change and visual disturbance.  Ears/Nose/Throat/Neck: Denied ear, nose, throat or neck symptoms, hearing loss, nasal discharge, sinus congestion and sore throat.  Cardiovascular: Denied cardiovascular symptoms, arrhythmia, chest pain/pressure, edema, exercise intolerance, orthopnea and palpitations.  Respiratory: Denied pulmonary symptoms, asthma, pleuritic pain, productive sputum, cough, dyspnea and wheezing.  Gastrointestinal: Denied, gastro-esophageal reflux, melena, nausea and vomiting.  Genitourinary: See HPI for additional information.  Musculoskeletal: Denied musculoskeletal symptoms, stiffness, swelling, muscle weakness and myalgia.  Dermatologic: Denied dermatology symptoms, rash and scar.  Neurologic: Denied neurology symptoms, dizziness, headache, neck pain and syncope.  Psychiatric: Denied psychiatric symptoms, anxiety and depression.  Endocrine: Denied endocrine symptoms including hot flashes and night sweats.   OB History  Gravida Para Term Preterm AB Living  3 3 3     3   SAB TAB Ectopic Multiple Live Births          3    # Outcome Date GA Lbr Len/2nd Weight Sex Delivery Anes PTL Lv  3 Term 07/10/03   6 lb (2.722 kg) F CS-High Vert  N LIV  2 Term 07/09/92   6 lb 13 oz (3.09 kg)  CS-LTranv  N LIV  1 Term 12/01/90   7 lb 9 oz (3.43 kg) F CS-High Vert  N LIV     Complications: Placenta Previa    Past Medical History:  Diagnosis Date  . Depression    . GERD (gastroesophageal reflux disease)   . Hypertension   . Ovarian tumor   . Renal disorder    conjoined kidney  . SVT (supraventricular tachycardia) (HCC)     Past Surgical History:  Procedure Laterality Date  . appebdectomy    . CESAREAN SECTION     x3   . VESICOVAGINAL FISTULA CLOSURE W/ TAH        SOCIAL HISTORY: Social History   Tobacco Use  Smoking Status Smoker, Current Status Unknown  Smokeless Tobacco Never Used   Social History   Substance and Sexual Activity  Alcohol Use No   Social History   Substance and Sexual Activity  Drug Use No    Family History  Problem Relation Age of Onset  . Coronary artery disease Other        GF  . Hypertension Mother   . Ovarian cancer Mother     ALLERGIES:  Patient has no known allergies.  MEDS:   Current Outpatient Medications on File Prior to Visit  Medication Sig Dispense Refill  . acetaminophen (TYLENOL) 325 MG tablet Take 650 mg by mouth every 6 (six) hours as needed.      . cephALEXin (KEFLEX) 500 MG capsule Take 1 capsule (500 mg total) by mouth 2 (two) times daily. 14 capsule 0  . omeprazole (PRILOSEC) 40 MG capsule Take 40 mg by mouth 2 (two) times daily.      . traZODone (DESYREL) 150 MG tablet Take 150 mg by mouth at bedtime as needed.    . umeclidinium-vilanterol (  ANORO ELLIPTA) 62.5-25 MCG/INH AEPB Inhale 1 puff into the lungs daily.     No current facility-administered medications on file prior to visit.     No orders of the defined types were placed in this encounter.    Physical examination BP (!) 153/86   Pulse 97   Ht 5\' 4"  (1.626 m)   Wt 168 lb 1.6 oz (76.2 kg)   BMI 28.85 kg/m   General NAD, Conversant  HEENT Atraumatic; Op clear with mmm.  Normo-cephalic. Pupils reactive. Anicteric sclerae  Thyroid/Neck Smooth without nodularity or enlargement. Normal ROM.  Neck Supple.  Skin No rashes, lesions or ulceration. Normal palpated skin turgor. No nodularity.  Breasts: No masses or  discharge.  Symmetric.  No axillary adenopathy.  Lungs: Clear to auscultation.No rales or wheezes. Normal Respiratory effort, no retractions.  Heart: NSR.  No murmurs or rubs appreciated. No periferal edema  Abdomen: Soft.  Non-tender.  No masses.  No HSM. No hernia  Extremities: Moves all appropriately.  Normal ROM for age. No lymphadenopathy.  Neuro: Oriented to PPT.  Normal mood. Normal affect.     Pelvic:   Vulva: Normal appearance.  No lesions.  Vagina: No lesions or abnormalities noted.  Support: Normal pelvic support.  Urethra No masses tenderness or scarring.  Meatus Normal size without lesions or prolapse.  Cervix:  Surgically absent  Anus: Normal exam.  No lesions.  Perineum: Normal exam.  No lesions.        Bimanual   Uterus:  Surgically absent  Adnexae: No masses.  Non-tender to palpation.  Cul-de-sac: Negative for abnormality.   Assessment:   [id number] Patient Active Problem List   Diagnosis Date Noted  . TOBACCO USER 06/27/2010  . CLAUDICATION 06/27/2010  . ELEVATED BLOOD PRESSURE WITHOUT DIAGNOSIS OF HYPERTENSION 06/27/2010  . SUPRAVENTRICULAR TACHYCARDIA 06/19/2010  . CHEST PAIN UNSPECIFIED 06/19/2010    1. Preop examination   2. Pelvic mass     Complex pelvic mass, pelvic pain low Roma score. Plan:   Orders: No orders of the defined types were placed in this encounter.    1.  Exploratory laparoscopy with bilateral oophorectomy.   Pre-op discussions regarding Risks and Benefits of her scheduled surgery.  Oophorectomy The option of Oophorectomy has been discussed with the patient.  Detailed risk/benefits have been reviewed.  The risks discussed include, but are not limited to, hemorrhage, infection, damage to ureter or other internal organ, and Ovarian Remnant Syndrome.  The benefits include a significant decrease in the risk of Ovarian Cancer and in benign Ovarian disease.  The risk of Ovarian CA has been estimated at 1 in 62.  This is a relatively small  risk.  However, should Ovarian CA develop, it is often found late in the course of the disease.  We have also discussed the role of inheritance in the development of Ovarian disease.  Some women, who have close relatives with Ovarian CA, have a higher than 1 in 70 risk of Ovarian CA.  The benefits of Estrogen replacement therapy following Oophorectomy has been stressed.  I have answered all of her questions, and I believe that she has an adequate and informed understanding of the risks and benefits of Oophorectomy. Low Roma score and risk for malignant potential discussed in detail. Patient made aware of gynecologic recommendations.

## 2019-09-28 LAB — URINE CULTURE: Culture: 40000 — AB

## 2019-10-05 ENCOUNTER — Telehealth: Payer: Self-pay | Admitting: Obstetrics and Gynecology

## 2019-10-05 NOTE — Telephone Encounter (Signed)
Spoke with pt concerning her FMLA forms and informed pt that DJE's nurse was not in the office today but would be in office tomorrow. Pt is aware that I will inform DJE's nurse concerning FMLA forms. Pt is requesting a call back informing her of the status of the FMLA forms.

## 2019-10-05 NOTE — Telephone Encounter (Signed)
The patient called to check the status of her FMLA paperwork. Pt stated that she needs it completed before 10/12/19 or she will have to reschedule her surgery. Please advise.

## 2019-10-06 NOTE — Telephone Encounter (Signed)
Spoke with patient and she is needing intermittent leave due to her appointments prior to her surgery. Her job is wanting to let her go because of the appointments leading up to surgery. I told her that I would call the FMLA source number and figure out what needs to be filled out to help with this. I have added in Pre op and Pre admit testing dates to [facility] paperwork. I have let the patient know this on her voicemail.

## 2019-10-09 ENCOUNTER — Encounter
Admission: RE | Admit: 2019-10-09 | Discharge: 2019-10-09 | Disposition: A | Discharge status: Home / Self Care | Payer: Managed Care, Other (non HMO) | Source: Ambulatory Visit | Attending: Obstetrics and Gynecology | Admitting: Obstetrics and Gynecology

## 2019-10-09 ENCOUNTER — Other Ambulatory Visit: Payer: Self-pay

## 2019-10-09 DIAGNOSIS — Z01812 Encounter for preprocedural laboratory examination: Secondary | ICD-10-CM | POA: Insufficient documentation

## 2019-10-09 DIAGNOSIS — Z01810 Encounter for preprocedural cardiovascular examination: Secondary | ICD-10-CM | POA: Insufficient documentation

## 2019-10-09 HISTORY — DX: Urinary tract infection, site not specified: N39.0

## 2019-10-09 HISTORY — DX: Other complications of anesthesia, initial encounter: T88.59XA

## 2019-10-09 HISTORY — DX: Chronic obstructive pulmonary disease, unspecified: J44.9

## 2019-10-09 HISTORY — DX: Family history of other specified conditions: Z84.89

## 2019-10-09 LAB — CBC
HCT: 41.3 % (ref 36.0–46.0)
Hemoglobin: 14 g/dL (ref 12.0–15.0)
MCH: 32.9 pg (ref 26.0–34.0)
MCHC: 33.9 g/dL (ref 30.0–36.0)
MCV: 96.9 fL (ref 80.0–100.0)
Platelets: 408 10*3/uL — ABNORMAL HIGH (ref 150–400)
RBC: 4.26 MIL/uL (ref 3.87–5.11)
RDW: 12.2 % (ref 11.5–15.5)
WBC: 9.9 10*3/uL (ref 4.0–10.5)
nRBC: 0 % (ref 0.0–0.2)

## 2019-10-09 NOTE — Patient Instructions (Signed)
Your procedure is scheduled on: 10/16/2019 Mon Report to Same Day Surgery 2nd floor medical mall [facility] Entrance-take elevator on left to 2nd floor.  Check in with surgery information desk.) To find out your arrival time please call [phone] between 1PM - 3PM on 10/13/2019 Fri  Remember: Instructions that are not followed completely may result in serious medical risk, up to and including death, or upon the discretion of your surgeon and anesthesiologist your surgery may need to be rescheduled.    _x___ 1. Do not eat food after midnight the night before your procedure. You may drink clear liquids up to 2 hours before you are scheduled to arrive at the hospital for your procedure.  Do not drink clear liquids within 2 hours of your scheduled arrival to the hospital.  Clear liquids include  --Water or Apple juice without pulp  --Clear carbohydrate beverage such as ClearFast or Gatorade  --Black Coffee or Clear Tea (No milk, no creamers, do not add anything to                  the coffee or Tea Type 1 and type 2 diabetics should only drink water.   ____Ensure clear carbohydrate drink on the way to the hospital for bariatric patients  ____Ensure clear carbohydrate drink 3 hours before surgery.   No gum chewing or hard candies.     __x__ 2. No Alcohol for 24 hours before or after surgery.   __x__3. No Smoking or e-cigarettes for 24 prior to surgery.  Do not use any chewable tobacco products for at least 6 hour prior to surgery   ____  4. Bring all medications with you on the day of surgery if instructed.    __x__ 5. Notify your doctor if there is any change in your medical condition     (cold, fever, infections).    x___6. On the morning of surgery brush your teeth with toothpaste and water.  You may rinse your mouth with mouth wash if you wish.  Do not swallow any toothpaste or mouthwash.   Do not wear jewelry, make-up, hairpins, clips or nail polish.  Do not wear lotions,  powders, or perfumes. You may wear deodorant.  Do not shave 48 hours prior to surgery. Men may shave face and neck.  Do not bring valuables to the hospital.    [facility] is not responsible for any belongings or valuables.               Contacts, dentures or bridgework may not be worn into surgery.  Leave your suitcase in the car. After surgery it may be brought to your room.  For patients admitted to the hospital, discharge time is determined by your                       treatment team.  _  Patients discharged the day of surgery will not be allowed to drive home.  You will need someone to drive you home and stay with you the night of your procedure.    Please read over the following fact sheets that you were given:   [facility] Preparing for Surgery and or MRSA Information   _x___ Take anti-hypertensive listed below, cardiac, seizure, asthma,     anti-reflux and psychiatric medicines. These include:  1. albuterol (VENTOLIN HFA) 108 (90 Base) MCG/ACT inhaler the morning of surgery and bring to hospital  2.amLODipine (NORVASC) 5 MG tablet  3.omeprazole (PRILOSEC) 40  MG capsule  4.umeclidinium-vilanterol (ANORO ELLIPTA) 62.5-25 MCG/INH AEPB  5.  6.  ____Fleets enema or Magnesium Citrate as directed.   _x___ Use CHG Soap or sage wipes as directed on instruction sheet   ____ Use inhalers on the day of surgery and bring to hospital day of surgery  ____ Stop Metformin and Janumet 2 days prior to surgery.    ____ Take 1/2 of usual insulin dose the night before surgery and none on the morning     surgery.   _x___ Follow recommendations from Cardiologist, Pulmonologist or PCP regarding          stopping Aspirin, Coumadin, Plavix ,Eliquis, Effient, or Pradaxa, and Pletal.  X____Stop Anti-inflammatories such as Advil, Aleve, Ibuprofen, Motrin, Naproxen, Naprosyn, Goodies powders or aspirin products. OK to take Tylenol and                          Celebrex.   _x___ Stop supplements until  after surgery.  But may continue Vitamin D, Vitamin B,       and multivitamin.   ____ Bring C-Pap to the hospital.

## 2019-10-09 NOTE — Pre-Procedure Instructions (Signed)
Carbohydrate drink given along with instructions.

## 2019-10-10 LAB — TYPE AND SCREEN
ABO/RH(D): O POS
Antibody Screen: NEGATIVE

## 2019-10-11 ENCOUNTER — Telehealth: Payer: Self-pay | Admitting: Obstetrics and Gynecology

## 2019-10-11 NOTE — Telephone Encounter (Signed)
Helene Kelp from [employer] called in regards to the patient to verify the reason why the patient was taken out of work prior to her surgery date. Helene Kelp from [employer] contact number is attached to visit info tab. Please advise.

## 2019-10-11 NOTE — Telephone Encounter (Signed)
Spoke with Amy Ferrell at [place]. Patient had put in for short term disability starting 09/26/2019. I let her know that we have put in for intermittent leave only for the days of her appointments for 3 hours at a time.

## 2019-10-12 ENCOUNTER — Other Ambulatory Visit: Payer: Self-pay

## 2019-10-12 ENCOUNTER — Other Ambulatory Visit
Admission: RE | Admit: 2019-10-12 | Discharge: 2019-10-12 | Disposition: A | Discharge status: Home / Self Care | Payer: Managed Care, Other (non HMO) | Source: Ambulatory Visit | Attending: Obstetrics and Gynecology | Admitting: Obstetrics and Gynecology

## 2019-10-12 DIAGNOSIS — Z01812 Encounter for preprocedural laboratory examination: Secondary | ICD-10-CM | POA: Diagnosis present

## 2019-10-12 DIAGNOSIS — Z20828 Contact with and (suspected) exposure to other viral communicable diseases: Secondary | ICD-10-CM | POA: Diagnosis not present

## 2019-10-12 LAB — SARS CORONAVIRUS 2 (TAT 6-24 HRS): SARS Coronavirus 2: NEGATIVE

## 2019-10-16 ENCOUNTER — Other Ambulatory Visit: Payer: Self-pay

## 2019-10-16 ENCOUNTER — Ambulatory Visit
Admission: RE | Admit: 2019-10-16 | Discharge: 2019-10-16 | Disposition: A | Discharge status: Home / Self Care | Payer: Managed Care, Other (non HMO) | Attending: Obstetrics and Gynecology | Admitting: Obstetrics and Gynecology

## 2019-10-16 ENCOUNTER — Telehealth: Payer: Self-pay

## 2019-10-16 ENCOUNTER — Ambulatory Visit: Payer: Managed Care, Other (non HMO) | Admitting: Certified Registered"

## 2019-10-16 ENCOUNTER — Encounter
Admission: RE | Disposition: A | Discharge status: Home / Self Care | Payer: Self-pay | Source: Home / Self Care | Attending: Obstetrics and Gynecology

## 2019-10-16 DIAGNOSIS — Z90710 Acquired absence of both cervix and uterus: Secondary | ICD-10-CM | POA: Diagnosis not present

## 2019-10-16 DIAGNOSIS — R1900 Intra-abdominal and pelvic swelling, mass and lump, unspecified site: Secondary | ICD-10-CM | POA: Insufficient documentation

## 2019-10-16 DIAGNOSIS — J449 Chronic obstructive pulmonary disease, unspecified: Secondary | ICD-10-CM | POA: Insufficient documentation

## 2019-10-16 DIAGNOSIS — F329 Major depressive disorder, single episode, unspecified: Secondary | ICD-10-CM | POA: Insufficient documentation

## 2019-10-16 DIAGNOSIS — K219 Gastro-esophageal reflux disease without esophagitis: Secondary | ICD-10-CM | POA: Insufficient documentation

## 2019-10-16 DIAGNOSIS — F17200 Nicotine dependence, unspecified, uncomplicated: Secondary | ICD-10-CM | POA: Insufficient documentation

## 2019-10-16 DIAGNOSIS — I10 Essential (primary) hypertension: Secondary | ICD-10-CM | POA: Insufficient documentation

## 2019-10-16 DIAGNOSIS — N736 Female pelvic peritoneal adhesions (postinfective): Secondary | ICD-10-CM | POA: Insufficient documentation

## 2019-10-16 DIAGNOSIS — K660 Peritoneal adhesions (postprocedural) (postinfection): Secondary | ICD-10-CM

## 2019-10-16 DIAGNOSIS — I471 Supraventricular tachycardia: Secondary | ICD-10-CM | POA: Insufficient documentation

## 2019-10-16 DIAGNOSIS — N8301 Follicular cyst of right ovary: Secondary | ICD-10-CM

## 2019-10-16 HISTORY — PX: LAPAROSCOPIC SALPINGO OOPHERECTOMY: SHX5927

## 2019-10-16 LAB — ABO/RH: ABO/RH(D): O POS

## 2019-10-16 SURGERY — SALPINGO-OOPHORECTOMY, LAPAROSCOPIC
Anesthesia: General | Laterality: Right

## 2019-10-16 MED ORDER — LIDOCAINE HCL (CARDIAC) PF 100 MG/5ML IV SOSY
PREFILLED_SYRINGE | INTRAVENOUS | Status: DC | PRN
  Administered 2019-10-16: 100 mg via INTRAVENOUS

## 2019-10-16 MED ORDER — ACETAMINOPHEN 10 MG/ML IV SOLN
INTRAVENOUS | Status: AC
  Filled 2019-10-16: qty 100

## 2019-10-16 MED ORDER — EPHEDRINE SULFATE 50 MG/ML IJ SOLN
INTRAMUSCULAR | Status: DC | PRN
  Administered 2019-10-16 (×3): 5 mg via INTRAVENOUS

## 2019-10-16 MED ORDER — OXYCODONE-ACETAMINOPHEN 5-325 MG PO TABS
ORAL_TABLET | ORAL | Status: AC
  Filled 2019-10-16: qty 1

## 2019-10-16 MED ORDER — FENTANYL CITRATE (PF) 100 MCG/2ML IJ SOLN
25.0000 ug | INTRAMUSCULAR | Status: DC | PRN
  Administered 2019-10-16 (×2): 25 ug via INTRAVENOUS

## 2019-10-16 MED ORDER — SUGAMMADEX SODIUM 500 MG/5ML IV SOLN
INTRAVENOUS | Status: DC | PRN
  Administered 2019-10-16: 300 mg via INTRAVENOUS

## 2019-10-16 MED ORDER — HYDROMORPHONE HCL 1 MG/ML IJ SOLN
INTRAMUSCULAR | Status: DC | PRN
  Administered 2019-10-16 (×2): .25 mg via INTRAVENOUS
  Administered 2019-10-16: 0.5 mg via INTRAVENOUS

## 2019-10-16 MED ORDER — LACTATED RINGERS IV SOLN
INTRAVENOUS | Status: DC | PRN
  Administered 2019-10-16 (×2): via INTRAVENOUS

## 2019-10-16 MED ORDER — FENTANYL CITRATE (PF) 100 MCG/2ML IJ SOLN
INTRAMUSCULAR | Status: AC
  Administered 2019-10-16: 25 ug via INTRAVENOUS
  Filled 2019-10-16: qty 2

## 2019-10-16 MED ORDER — ONDANSETRON HCL 4 MG/2ML IJ SOLN
INTRAMUSCULAR | Status: DC | PRN
  Administered 2019-10-16 (×2): 4 mg via INTRAVENOUS

## 2019-10-16 MED ORDER — HYDROMORPHONE HCL 1 MG/ML IJ SOLN
INTRAMUSCULAR | Status: AC
  Filled 2019-10-16: qty 1

## 2019-10-16 MED ORDER — LACTATED RINGERS IV SOLN: INTRAVENOUS | Status: DC

## 2019-10-16 MED ORDER — DEXMEDETOMIDINE HCL IN NACL 200 MCG/50ML IV SOLN
INTRAVENOUS | Status: DC | PRN
  Administered 2019-10-16 (×2): 4 ug via INTRAVENOUS

## 2019-10-16 MED ORDER — FENTANYL CITRATE (PF) 100 MCG/2ML IJ SOLN
INTRAMUSCULAR | Status: DC | PRN
  Administered 2019-10-16 (×2): 50 ug via INTRAVENOUS

## 2019-10-16 MED ORDER — DEXTROSE IN LACTATED RINGERS 5 % IV SOLN: INTRAVENOUS | Status: DC

## 2019-10-16 MED ORDER — PHENYLEPHRINE HCL (PRESSORS) 10 MG/ML IV SOLN
INTRAVENOUS | Status: DC | PRN
  Administered 2019-10-16 (×3): 100 ug via INTRAVENOUS

## 2019-10-16 MED ORDER — KETOROLAC TROMETHAMINE 30 MG/ML IJ SOLN
30.0000 mg | Freq: Once | INTRAMUSCULAR | Status: AC
  Administered 2019-10-16: 30 mg via INTRAVENOUS

## 2019-10-16 MED ORDER — ROCURONIUM BROMIDE 100 MG/10ML IV SOLN
INTRAVENOUS | Status: DC | PRN
  Administered 2019-10-16 (×5): 10 mg via INTRAVENOUS
  Administered 2019-10-16: 40 mg via INTRAVENOUS

## 2019-10-16 MED ORDER — BUPIVACAINE HCL 0.5 % IJ SOLN
INTRAMUSCULAR | Status: DC | PRN
  Administered 2019-10-16: 10 mL

## 2019-10-16 MED ORDER — PROPOFOL 10 MG/ML IV BOLUS
INTRAVENOUS | Status: DC | PRN
  Administered 2019-10-16: 150 mg via INTRAVENOUS

## 2019-10-16 MED ORDER — ONDANSETRON HCL 4 MG/2ML IJ SOLN: 4.0000 mg | Freq: Four times a day (QID) | INTRAMUSCULAR | Status: DC | PRN

## 2019-10-16 MED ORDER — LACTATED RINGERS IV SOLN
INTRAVENOUS | Status: DC
  Administered 2019-10-16: 12:00:00 via INTRAVENOUS

## 2019-10-16 MED ORDER — ALBUTEROL SULFATE HFA 108 (90 BASE) MCG/ACT IN AERS
INHALATION_SPRAY | RESPIRATORY_TRACT | Status: DC | PRN
  Administered 2019-10-16: 2 via RESPIRATORY_TRACT

## 2019-10-16 MED ORDER — FENTANYL CITRATE (PF) 100 MCG/2ML IJ SOLN
INTRAMUSCULAR | Status: AC
  Filled 2019-10-16: qty 2

## 2019-10-16 MED ORDER — DEXAMETHASONE SODIUM PHOSPHATE 10 MG/ML IJ SOLN
INTRAMUSCULAR | Status: DC | PRN
  Administered 2019-10-16: 10 mg via INTRAVENOUS

## 2019-10-16 MED ORDER — KETOROLAC TROMETHAMINE 30 MG/ML IJ SOLN
INTRAMUSCULAR | Status: AC
  Filled 2019-10-16: qty 1

## 2019-10-16 MED ORDER — ONDANSETRON 4 MG PO TBDP: 4.0000 mg | ORAL_TABLET | Freq: Four times a day (QID) | ORAL | Status: DC | PRN

## 2019-10-16 MED ORDER — ONDANSETRON HCL 4 MG/2ML IJ SOLN: 4.0000 mg | Freq: Once | INTRAMUSCULAR | Status: DC | PRN

## 2019-10-16 MED ORDER — GLYCOPYRROLATE 0.2 MG/ML IJ SOLN
INTRAMUSCULAR | Status: DC | PRN
  Administered 2019-10-16: 0.2 mg via INTRAVENOUS

## 2019-10-16 MED ORDER — ACETAMINOPHEN 10 MG/ML IV SOLN
INTRAVENOUS | Status: DC | PRN
  Administered 2019-10-16: 1000 mg via INTRAVENOUS

## 2019-10-16 MED ORDER — OXYCODONE-ACETAMINOPHEN 5-325 MG PO TABS: 1.0000 | ORAL_TABLET | Freq: Four times a day (QID) | ORAL | 0 refills | Status: DC | PRN

## 2019-10-16 MED ORDER — MIDAZOLAM HCL 2 MG/2ML IJ SOLN
INTRAMUSCULAR | Status: AC
  Filled 2019-10-16: qty 2

## 2019-10-16 MED ORDER — MIDAZOLAM HCL 2 MG/2ML IJ SOLN
INTRAMUSCULAR | Status: DC | PRN
  Administered 2019-10-16: 2 mg via INTRAVENOUS

## 2019-10-16 MED ORDER — OXYCODONE-ACETAMINOPHEN 5-325 MG PO TABS
1.0000 | ORAL_TABLET | ORAL | Status: DC | PRN
  Administered 2019-10-16: 1 via ORAL

## 2019-10-16 MED ORDER — SUCCINYLCHOLINE CHLORIDE 20 MG/ML IJ SOLN
INTRAMUSCULAR | Status: DC | PRN
  Administered 2019-10-16: 120 mg via INTRAVENOUS

## 2019-10-16 MED ORDER — BUPIVACAINE HCL (PF) 0.5 % IJ SOLN
INTRAMUSCULAR | Status: AC
  Filled 2019-10-16: qty 30

## 2019-10-16 SURGICAL SUPPLY — 50 items
ANCHOR TIS RET SYS 235ML (MISCELLANEOUS) ×4 IMPLANT
BAG DECANTER FOR FLEXI CONT (MISCELLANEOUS) ×4 IMPLANT
BLADE SURG 15 STRL LF DISP TIS (BLADE) ×2 IMPLANT
BLADE SURG 15 STRL SS (BLADE) ×2
BLADE SURG SZ11 CARB STEEL (BLADE) ×4 IMPLANT
CANISTER SUCT 1200ML W/VALVE (MISCELLANEOUS) ×4 IMPLANT
CATH ROBINSON RED A/P 16FR (CATHETERS) ×4 IMPLANT
CHLORAPREP W/TINT 26 (MISCELLANEOUS) ×4 IMPLANT
CLOSURE WOUND 1/2 X4 (GAUZE/BANDAGES/DRESSINGS) ×1
COVER WAND RF STERILE (DRAPES) IMPLANT
DERMABOND ADVANCED (GAUZE/BANDAGES/DRESSINGS) ×2
DERMABOND ADVANCED .7 DNX12 (GAUZE/BANDAGES/DRESSINGS) ×2 IMPLANT
ELECT REM PT RETURN 9FT ADLT (ELECTROSURGICAL) ×4
ELECTRODE REM PT RTRN 9FT ADLT (ELECTROSURGICAL) ×2 IMPLANT
GLOVE BIO SURGEON STRL SZ 6.5 (GLOVE) ×9 IMPLANT
GLOVE BIO SURGEONS STRL SZ 6.5 (GLOVE) ×3
GLOVE BIOGEL PI IND STRL 7.0 (GLOVE) ×2 IMPLANT
GLOVE BIOGEL PI INDICATOR 7.0 (GLOVE) ×2
GLOVE BIOGEL PI ORTHO PRO 7.5 (GLOVE) ×2
GLOVE INDICATOR 7.0 STRL GRN (GLOVE) ×4 IMPLANT
GLOVE PI ORTHO PRO STRL 7.5 (GLOVE) ×2 IMPLANT
GLOVE SURG SYN 8.0 (GLOVE) ×4 IMPLANT
GOWN STRL REUS W/ TWL LRG LVL3 (GOWN DISPOSABLE) ×4 IMPLANT
GOWN STRL REUS W/TWL LRG LVL3 (GOWN DISPOSABLE) ×4
GOWN STRL REUS W/TWL XL LVL4 (GOWN DISPOSABLE) ×4 IMPLANT
IRRIGATION STRYKERFLOW (MISCELLANEOUS) ×2 IMPLANT
IRRIGATOR STRYKERFLOW (MISCELLANEOUS) ×4
IV LACTATED RINGERS 1000ML (IV SOLUTION) ×4 IMPLANT
KIT PINK PAD W/HEAD ARE REST (MISCELLANEOUS) ×4
KIT PINK PAD W/HEAD ARM REST (MISCELLANEOUS) ×2 IMPLANT
KIT TURNOVER CYSTO (KITS) ×4 IMPLANT
LIGASURE LAP MARYLAND 5MM 37CM (ELECTROSURGICAL) IMPLANT
NEEDLE HYPO 22GX1.5 SAFETY (NEEDLE) ×4 IMPLANT
NS IRRIG 500ML POUR BTL (IV SOLUTION) ×4 IMPLANT
PACK GYN LAPAROSCOPIC (MISCELLANEOUS) ×4 IMPLANT
PAD OB MATERNITY 4.3X12.25 (PERSONAL CARE ITEMS) ×4 IMPLANT
PAD PREP 24X41 OB/GYN DISP (PERSONAL CARE ITEMS) ×4 IMPLANT
SET TUBE SMOKE EVAC HIGH FLOW (TUBING) ×4 IMPLANT
SLEEVE ENDOPATH XCEL 5M (ENDOMECHANICALS) ×8 IMPLANT
STRIP CLOSURE SKIN 1/2X4 (GAUZE/BANDAGES/DRESSINGS) ×3 IMPLANT
SUT VICRYL 0 AB UR-6 (SUTURE) ×4 IMPLANT
SUT VICRYL 4-0  27 PS-2 BARIAT (SUTURE) ×2
SUT VICRYL 4-0 27 PS-2 BARIAT (SUTURE) ×2
SUTURE VICRYL 4-0 27 PS-2 BART (SUTURE) ×2 IMPLANT
TOWEL OR 17X26 4PK STRL BLUE (TOWEL DISPOSABLE) ×4 IMPLANT
TROCAR ENDO BLADELESS 11MM (ENDOMECHANICALS) ×4 IMPLANT
TROCAR XCEL NON-BLD 5MMX100MML (ENDOMECHANICALS) IMPLANT
TROCAR XCEL UNIV SLVE 11M 100M (ENDOMECHANICALS) ×4 IMPLANT
TUBING CONNECTING 10 (TUBING) ×3 IMPLANT
TUBING CONNECTING 10' (TUBING) ×1

## 2019-10-16 NOTE — Telephone Encounter (Signed)
Scheduled two week S/P surgery office visit with Dr. Amalia Hailey. Please advise if provider prefers sooner appointment.

## 2019-10-16 NOTE — Interval H&P Note (Signed)
History and Physical Interval Note:  10/16/2019 12:13 PM  Amy Ferrell  has presented today for surgery, with the diagnosis of PELVIC MASS.  The various methods of treatment have been discussed with the patient and family. After consideration of risks, benefits and other options for treatment, the patient has consented to  Procedure(s): LAPAROSCOPIC BILATERAL SALPINGO OOPHORECTOMY (Bilateral) as a surgical intervention.  The patient's history has been reviewed, patient examined, no change in status, stable for surgery.  I have reviewed the patient's chart and labs.  Questions were answered to the patient's satisfaction.     Jeannie Fend

## 2019-10-16 NOTE — Anesthesia Post-op Follow-up Note (Signed)
Anesthesia QCDR form completed.        

## 2019-10-16 NOTE — Anesthesia Preprocedure Evaluation (Addendum)
Anesthesia Evaluation    History of Anesthesia Complications (+) PROLONGED EMERGENCE, Family history of anesthesia reaction and history of anesthetic complications  Airway Mallampati: III       Dental  (+) Edentulous Upper   Pulmonary COPD,    Pulmonary exam normal        Cardiovascular hypertension, + Peripheral Vascular Disease  Normal cardiovascular exam+ dysrhythmias Supra Ventricular Tachycardia      Neuro/Psych PSYCHIATRIC DISORDERS Depression    GI/Hepatic GERD  ,  Endo/Other  negative endocrine ROS  Renal/GU   Female GU complaint     Musculoskeletal negative musculoskeletal ROS (+)   Abdominal Normal abdominal exam  (+)   Peds  Hematology negative hematology ROS (+)   Anesthesia Other Findings Past Medical History: No date: Complication of anesthesia     Comment:  2004 spinal did not work  No date: COPD (chronic obstructive pulmonary disease) (HCC) No date: Depression No date: Family history of adverse reaction to anesthesia     Comment:  mom slow to wake up No date: GERD (gastroesophageal reflux disease) No date: Hypertension No date: Ovarian tumor No date: Renal disorder     Comment:  conjoined kidney No date: SVT (supraventricular tachycardia) (HCC) No date: UTI (urinary tract infection)  Reproductive/Obstetrics                            Anesthesia Physical Anesthesia Plan  ASA: III  Anesthesia Plan: General   Post-op Pain Management:    Induction: Intravenous  PONV Risk Score and Plan:   Airway Management Planned: Oral ETT  Additional Equipment:   Intra-op Plan:   Post-operative Plan: Extubation in OR  Informed Consent: I have reviewed the patients History and Physical, chart, labs and discussed the procedure including the risks, benefits and alternatives for the proposed anesthesia with the patient or authorized representative who has indicated his/her  understanding and acceptance.     Dental advisory given  Plan Discussed with: CRNA and Surgeon  Anesthesia Plan Comments:         Anesthesia Quick Evaluation

## 2019-10-16 NOTE — Transfer of Care (Signed)
Immediate Anesthesia Transfer of Care Note  Patient: SUMMAH STURDEVANT  Procedure(s) Performed: LAPAROSCOPIC SALPINGO OOPHORECTOMY (Right )  Patient Location: PACU  Anesthesia Type:General  Level of Consciousness: sedated  Airway & Oxygen Therapy: Patient Spontanous Breathing and Patient connected to face mask oxygen  Post-op Assessment: Report given to RN and Post -op Vital signs reviewed and stable  Post vital signs: Reviewed and stable  Last Vitals:  Vitals Value Taken Time  BP 104/69 10/16/19 1534  Temp 36.7 C 10/16/19 1534  Pulse 89 10/16/19 1534  Resp    SpO2 6 % 10/16/19 1534    Last Pain:  Vitals:   10/16/19 1534  PainSc: 0-No pain         Complications: No apparent anesthesia complications

## 2019-10-16 NOTE — Op Note (Signed)
@  LOGO@    OPERATIVE NOTE 10/16/2019 3:29 PM  PRE-OPERATIVE DIAGNOSIS:  1) PELVIC MASS 2) extensive pelvic adhesions mostly omentum. 3) extensive anterior abdominal wall adhesions   POST-OPERATIVE DIAGNOSIS:  1) extensive pelvic adhesions mostly omentum. 2) extensive anterior abdominal wall adhesions 3) no evidence of left ovary    OPERATION:  LAPAROSCOPIC SALPINGO OOPHORECTOMY:  Right Extensive lysis of abdominal wall cul-de-sac and pelvic adhesions  SURGEON(S): Surgeon(s) and Role:    Harlin Heys, MD - Primary    * Rubie Maid, MD - Assisting No other capable assistant available for this surgery which requires an experienced, high level assistant.  ANESTHESIA: General  ESTIMATED BLOOD LOSS: 80 mL  OPERATIVE FINDINGS: No evidence of left-sided pelvic mass bowel and omentum significantly adhesed in the cul-de-sac left sidewall and anterior abdominal wall from pubic bone to umbilicus. Upper abdomen appeared normal  SPECIMEN:  ID Type Source Tests Collected by Time Destination  1 : right ovary and fallopian tube GYN Fallopian Tube and Ovary, Right SURGICAL PATHOLOGY Harlin Heys, MD [id number]     COMPLICATIONS: None  DISPOSITION: Stable to recovery room  DESCRIPTION OF PROCEDURE:      The patient was prepped and draped in the dorsolithotomy position and placed under general anesthesia. The bladder was emptied. T 2 sponges on a sponge stick were placed in the vagina after moistening.   After changing gloves we proceeded abdominally. A small infraumbilical incision was made and a 5 mm trocar port was placed within the abdominopelvic cavity. The opening pressure was less than 7 mmHg.  Approximately 3 and 1/2 L of carbon dioxide gas was instilled within the abdominal pelvic cavity. The laparoscope was placed and the pelvis and abdomen were carefully inspected. Extensive adhesions were encountered and we were able to identify a clear spot in the right  abdominal wall where secondary trocar was placed.  We then placed the camera through this port and were able to systematically dissect some of the omentum of the anterior abdominal wall.  Blunt and sharp dissection was used.  The right tube and ovary were identified.  The cul-de-sac and left adnexa were completely socked in with omental and bowel adhesions After identifying a clear spot on the anterior abdominal wall a 12 mm trocar was placed within the pelvis from the left lower quadrant.  We continued to dissect omentum off the anterior abdominal wall and deep within the pelvis exploring for the left ovary care was taken to avoid bowel and bladder.  After extensive dissection no left ovary was identified we proceeded to the right ovary it was retracted medially and the infundibulopelvic ligament was doubly coagulated and divided.  The specimen was placed in an Endo Catch bag and removed from the abdomen.  The pelvis was irrigated and inspected hemostasis was noted.  After irrigation we again inspected for the left ovary and could find no evidence of this. Hemostasis of all areas of the pelvis was noted. The ports were removed under direct visualization.  Hemostasis was noted.  The laparoscope was removed the trocar sleeve was removed and the incision was closed with a deep suture through the fascia of 0 Vicryl followed by subcuticular closure of the skin for all port sites. A long-acting anesthetic was injected.  Dermabond was applied. The sponges and ring forceps were removed.  The patient went to the recovery room in stable condition.  Finis Bud, M.D. 10/16/2019 3:29 PM

## 2019-10-16 NOTE — Discharge Instructions (Signed)

## 2019-10-16 NOTE — Anesthesia Procedure Notes (Signed)
Procedure Name: Intubation Performed by: Fletcher-Harrison, Wilburt Messina, CRNA Pre-anesthesia Checklist: Patient identified, Emergency Drugs available, Suction available and Patient being monitored Patient Re-evaluated:Patient Re-evaluated prior to induction Oxygen Delivery Method: Circle system utilized Preoxygenation: Pre-oxygenation with 100% oxygen Induction Type: IV induction Ventilation: Mask ventilation without difficulty Laryngoscope Size: McGraph and 3 Grade View: Grade I Tube type: Oral Tube size: 6.5 mm Number of attempts: 1 Airway Equipment and Method: Stylet Placement Confirmation: ETT inserted through vocal cords under direct vision,  positive ETCO2,  CO2 detector and breath sounds checked- equal and bilateral Secured at: 21 cm Tube secured with: Tape Dental Injury: Teeth and Oropharynx as per pre-operative assessment        

## 2019-10-17 ENCOUNTER — Encounter: Payer: Self-pay | Admitting: Obstetrics and Gynecology

## 2019-10-17 ENCOUNTER — Telehealth: Payer: Self-pay | Admitting: Obstetrics and Gynecology

## 2019-10-17 NOTE — Telephone Encounter (Signed)
Spoke with patient and let her know that she would need to call her primary doctor to have them sign off on medication. If they are waiting for PCP to sign off there is not anything that we can do. She will call the PCP. She also stated that her FMLA said they was sending something over for continuous leave. I let her know that it was on her last form,but if I receive anything I will fill it out.

## 2019-10-17 NOTE — Telephone Encounter (Signed)
Per Dr. Amalia Hailey appointment should be 1 week post op. I have rescheduled.

## 2019-10-17 NOTE — Telephone Encounter (Signed)
The patient called and stated that she is in an unbearable amount of pain. Patient stated she is unable to pick up her pain medication at her pharmacy until her primary care office/provider signs off on it. Pt is requesting a call back as soon as possible. Pt very distraught and crying on phone. Please advise.

## 2019-10-17 NOTE — Anesthesia Postprocedure Evaluation (Signed)
Anesthesia Post Note  Patient: Amy Ferrell  Procedure(s) Performed: LAPAROSCOPIC SALPINGO OOPHORECTOMY (Right )  Anesthesia Type: General Level of consciousness: awake and alert and oriented Pain management: pain level controlled Vital Signs Assessment: post-procedure vital signs reviewed and stable Respiratory status: spontaneous breathing Cardiovascular status: blood pressure returned to baseline Anesthetic complications: no     Last Vitals:  Vitals:   10/16/19 1704 10/16/19 1729  BP: 111/67 (!) 101/55  Pulse: 71 99  Resp: 18 16  Temp: 36.5 C   SpO2: 95% 96%    Last Pain:  Vitals:   10/17/19 0812  TempSrc:   PainSc: 8                  Sigifredo Pignato

## 2019-10-18 LAB — SURGICAL PATHOLOGY

## 2019-10-20 ENCOUNTER — Other Ambulatory Visit: Payer: Self-pay | Admitting: Surgical

## 2019-10-20 MED ORDER — NITROFURANTOIN MONOHYD MACRO 100 MG PO CAPS: 100.0000 mg | ORAL_CAPSULE | Freq: Two times a day (BID) | ORAL | 0 refills | Status: DC

## 2019-10-24 ENCOUNTER — Telehealth: Payer: Self-pay | Admitting: Obstetrics and Gynecology

## 2019-10-24 ENCOUNTER — Ambulatory Visit (INDEPENDENT_AMBULATORY_CARE_PROVIDER_SITE_OTHER): Payer: Managed Care, Other (non HMO) | Admitting: Obstetrics and Gynecology

## 2019-10-24 ENCOUNTER — Other Ambulatory Visit: Payer: Self-pay

## 2019-10-24 ENCOUNTER — Encounter: Payer: Self-pay | Admitting: Obstetrics and Gynecology

## 2019-10-24 VITALS — BP 156/92 | HR 94 | Wt 163.9 lb

## 2019-10-24 DIAGNOSIS — N3001 Acute cystitis with hematuria: Secondary | ICD-10-CM

## 2019-10-24 DIAGNOSIS — Z9889 Other specified postprocedural states: Secondary | ICD-10-CM

## 2019-10-24 DIAGNOSIS — R102 Pelvic and perineal pain: Secondary | ICD-10-CM

## 2019-10-24 NOTE — Progress Notes (Signed)
HPI:      [name] is a 53 y.o. [phone] who LMP was No LMP recorded. Patient has had a hysterectomy.  Subjective:   She presents today generally doing well postop.  Denies significant pain or discomfort.  She does complain of hesitancy with urination and a dark color to her urine.  She is taking Macrobid as directed but has noticed no improvement yet.  Feels some pressure symptoms with urination.     Hx: The following portions of the patient's history were reviewed and updated as appropriate:                     Review of Systems:  Review of Systems  Constitutional: Denied constitutional symptoms, night sweats, recent illness, fatigue, fever, insomnia and weight loss.  Eyes: Denied eye symptoms, eye pain, photophobia, vision change and visual disturbance.  Ears/Nose/Throat/Neck: Denied ear, nose, throat or neck symptoms, hearing loss, nasal discharge, sinus congestion and sore throat.  Cardiovascular: Denied cardiovascular symptoms, arrhythmia, chest pain/pressure, edema, exercise intolerance, orthopnea and palpitations.  Respiratory: Denied pulmonary symptoms, asthma, pleuritic pain, productive sputum, cough, dyspnea and wheezing.  Gastrointestinal: Denied, gastro-esophageal reflux, melena, nausea and vomiting.  Genitourinary: See HPI for additional information.  Musculoskeletal: Denied musculoskeletal symptoms, stiffness, swelling, muscle weakness and myalgia.  Dermatologic: Denied dermatology symptoms, rash and scar.  Neurologic: Denied neurology symptoms, dizziness, headache, neck pain and syncope.  Psychiatric: Denied psychiatric symptoms, anxiety and depression.  Endocrine: Denied endocrine symptoms including hot flashes and night sweats.   Meds:   Current Outpatient Medications on File Prior to Visit  Medication Sig Dispense Refill  . acetaminophen (TYLENOL) 325 MG tablet Take 650 mg by mouth every 6 (six) hours as needed.      Marland Kitchen albuterol (VENTOLIN HFA) 108 (90  Base) MCG/ACT inhaler Inhale 1-2 puffs into the lungs every 4 (four) hours as needed for wheezing or shortness of breath.    Marland Kitchen amLODipine (NORVASC) 5 MG tablet Take 5 mg by mouth daily.    . nitrofurantoin, macrocrystal-monohydrate, (MACROBID) 100 MG capsule Take 1 capsule (100 mg total) by mouth 2 (two) times daily. 14 capsule 0  . omeprazole (PRILOSEC) 40 MG capsule Take 40 mg by mouth 2 (two) times daily.      . traZODone (DESYREL) 100 MG tablet Take 50 mg by mouth at bedtime.     Marland Kitchen umeclidinium-vilanterol (ANORO ELLIPTA) 62.5-25 MCG/INH AEPB Inhale 1 puff into the lungs daily.    Marland Kitchen oxyCODONE-acetaminophen (PERCOCET/ROXICET) 5-325 MG tablet Take 1-2 tablets by mouth every 6 (six) hours as needed. (Patient not taking: Reported on 10/24/2019) 20 tablet 0   No current facility-administered medications on file prior to visit.     Objective:     Vitals:   10/24/19 1031  BP: (!) 156/92  Pulse: 94               Abdomen: Soft.  Non-tender.  No masses.  No HSM.  Incision/s: Intact.  Healing well.  No erythema.  No drainage.   Some bruising noted around the right lower quadrant incision but not tense and a resolving bruise.   Assessment:    [id number] Patient Active Problem List   Diagnosis Date Noted  . TOBACCO USER 06/27/2010  . CLAUDICATION 06/27/2010  . ELEVATED BLOOD PRESSURE WITHOUT DIAGNOSIS OF HYPERTENSION 06/27/2010  . SUPRAVENTRICULAR TACHYCARDIA 06/19/2010  . CHEST PAIN UNSPECIFIED 06/19/2010     1. Pelvic pain in female   2. Post-operative state  3. Acute cystitis with hematuria     Patient not significantly better with Macrobid.  Otherwise well after surgery.   Plan:            1.  Urine for culture-we will contact patient as soon as results are available.  Patient to begin AZO for pelvic pressure symptoms.  Orders Orders Placed This Encounter  Procedures  . Urine Culture    No orders of the defined types were placed in this encounter.     F/U  Return in  about 5 weeks (around 11/28/2019).  Finis Bud, M.D. 10/24/2019 11:35 AM

## 2019-10-24 NOTE — Telephone Encounter (Signed)
The patient is returning to work Monday 10/30/19. The patient needs a (Release/Return back to work note). Pt is requesting her nurse send the note to her on MyChart, so pt is able to print and turn in document to her employer. Please advise.

## 2019-10-24 NOTE — Progress Notes (Signed)
Patient comes in today for Post op.

## 2019-10-25 ENCOUNTER — Encounter: Payer: Self-pay | Admitting: Surgical

## 2019-10-25 NOTE — Telephone Encounter (Signed)
Pt FU on request for letter to return to work 10/30/19. Pt request put letter in [facility] if possible.  If not, she will come pick it up.

## 2019-10-25 NOTE — Telephone Encounter (Signed)
Letter has been printed and patient is coming to pick up.

## 2019-10-26 LAB — URINE CULTURE: Organism ID, Bacteria: NO GROWTH

## 2019-10-31 ENCOUNTER — Encounter: Payer: Managed Care, Other (non HMO) | Admitting: Obstetrics and Gynecology

## 2019-11-28 ENCOUNTER — Encounter: Payer: Managed Care, Other (non HMO) | Admitting: Obstetrics and Gynecology

## 2019-12-06 ENCOUNTER — Other Ambulatory Visit: Payer: Self-pay

## 2019-12-06 ENCOUNTER — Emergency Department: Payer: Managed Care, Other (non HMO)

## 2019-12-06 ENCOUNTER — Emergency Department
Admission: EM | Admit: 2019-12-06 | Discharge: 2019-12-06 | Disposition: A | Discharge status: Home / Self Care | Payer: Managed Care, Other (non HMO) | Attending: Emergency Medicine | Admitting: Emergency Medicine

## 2019-12-06 DIAGNOSIS — Z79899 Other long term (current) drug therapy: Secondary | ICD-10-CM | POA: Diagnosis not present

## 2019-12-06 DIAGNOSIS — R05 Cough: Secondary | ICD-10-CM | POA: Diagnosis present

## 2019-12-06 DIAGNOSIS — R0602 Shortness of breath: Secondary | ICD-10-CM | POA: Diagnosis not present

## 2019-12-06 DIAGNOSIS — R519 Headache, unspecified: Secondary | ICD-10-CM | POA: Insufficient documentation

## 2019-12-06 DIAGNOSIS — Z20822 Contact with and (suspected) exposure to covid-19: Secondary | ICD-10-CM

## 2019-12-06 DIAGNOSIS — J441 Chronic obstructive pulmonary disease with (acute) exacerbation: Secondary | ICD-10-CM

## 2019-12-06 DIAGNOSIS — Z20828 Contact with and (suspected) exposure to other viral communicable diseases: Secondary | ICD-10-CM | POA: Diagnosis not present

## 2019-12-06 DIAGNOSIS — R509 Fever, unspecified: Secondary | ICD-10-CM | POA: Diagnosis not present

## 2019-12-06 DIAGNOSIS — F17210 Nicotine dependence, cigarettes, uncomplicated: Secondary | ICD-10-CM | POA: Insufficient documentation

## 2019-12-06 MED ORDER — PREDNISONE 10 MG PO TABS: ORAL_TABLET | ORAL | 0 refills | Status: AC

## 2019-12-06 MED ORDER — HYDROCODONE-HOMATROPINE 5-1.5 MG/5ML PO SYRP: 5.0000 mL | ORAL_SOLUTION | Freq: Four times a day (QID) | ORAL | 0 refills | Status: DC | PRN

## 2019-12-06 MED ORDER — AEROCHAMBER Z-STAT PLUS/MEDIUM MISC
1.0000 | Freq: Once | Status: DC
  Filled 2019-12-06: qty 1

## 2019-12-06 MED ORDER — PREDNISONE 20 MG PO TABS
60.0000 mg | ORAL_TABLET | Freq: Once | ORAL | Status: AC
  Administered 2019-12-06: 60 mg via ORAL
  Filled 2019-12-06: qty 3

## 2019-12-06 MED ORDER — AZITHROMYCIN 250 MG PO TABS: ORAL_TABLET | ORAL | 0 refills | Status: DC

## 2019-12-06 MED ORDER — ALBUTEROL SULFATE HFA 108 (90 BASE) MCG/ACT IN AERS
6.0000 | INHALATION_SPRAY | Freq: Once | RESPIRATORY_TRACT | Status: AC
  Administered 2019-12-06: 6 via RESPIRATORY_TRACT
  Filled 2019-12-06: qty 6.7

## 2019-12-06 NOTE — ED Provider Notes (Signed)
[facility] Emergency Department Provider Note  ____________________________________________   First MD Initiated Contact with Patient 12/06/19 [phone]     (approximate)  I have reviewed the triage vital signs and the nursing notes.   HISTORY  Chief Complaint Possible covid    HPI Amy Ferrell is a 53 y.o. female with past medical history of COPD here with cough, fever, shortness of breath.  The patient states that her symptoms started 2-1/2 days ago.  She states that she began to develop subjective chills, fevers, cough, and wheezing.  She has felt generally tired.  She had a mild headache.  She had some occasional left-sided chest pain with coughing, that then resolves.  No leg swelling.  She states her husband works at [employer], where multiple people have not been wearing masks.  He also has similar symptoms.  Denies any known overt Covid exposures.  No rash.  Her shortness of breath is primarily present with exertion.  She has mild improvement with her home albuterol.  No other complaints.  No pain currently.        Past Medical History:  Diagnosis Date  . Complication of anesthesia    2004 spinal did not work   . COPD (chronic obstructive pulmonary disease) ([place])   . Depression   . Family history of adverse reaction to anesthesia    mom slow to wake up  . GERD (gastroesophageal reflux disease)   . Hypertension   . Ovarian tumor   . Renal disorder    conjoined kidney  . SVT (supraventricular tachycardia) ([place])   . UTI (urinary tract infection)     Patient Active Problem List   Diagnosis Date Noted  . TOBACCO USER 06/27/2010  . CLAUDICATION 06/27/2010  . ELEVATED BLOOD PRESSURE WITHOUT DIAGNOSIS OF HYPERTENSION 06/27/2010  . SUPRAVENTRICULAR TACHYCARDIA 06/19/2010  . CHEST PAIN UNSPECIFIED 06/19/2010    Past Surgical History:  Procedure Laterality Date  . ABDOMINAL HYSTERECTOMY    . appebdectomy    . APPENDECTOMY    . CESAREAN SECTION      x3   . CHOLECYSTECTOMY    . LAPAROSCOPIC SALPINGO OOPHERECTOMY Right 10/16/2019   Procedure: LAPAROSCOPIC SALPINGO OOPHORECTOMY;  Surgeon: Harlin Heys, MD;  Location: [facility] ORS;  Service: Gynecology;  Laterality: Right;  Marland Kitchen VESICOVAGINAL FISTULA CLOSURE W/ TAH      Prior to Admission medications   Medication Sig Start Date End Date Taking? Authorizing Provider  acetaminophen (TYLENOL) 325 MG tablet Take 650 mg by mouth every 6 (six) hours as needed.      [provider]  albuterol (VENTOLIN HFA) 108 (90 Base) MCG/ACT inhaler Inhale 1-2 puffs into the lungs every 4 (four) hours as needed for wheezing or shortness of breath.    [provider]  amLODipine (NORVASC) 5 MG tablet Take 5 mg by mouth daily.    [provider]  azithromycin (ZITHROMAX Z-PAK) 250 MG tablet Take 2 tablets (500 mg) on  Day 1,  followed by 1 tablet (250 mg) once daily on Days 2 through 5. 12/06/19   Duffy Bruce, MD  HYDROcodone-homatropine [employer]) 5-1.5 MG/5ML syrup Take 5 mLs by mouth every 6 (six) hours as needed for cough. 12/06/19   Duffy Bruce, MD  nitrofurantoin, macrocrystal-monohydrate, (MACROBID) 100 MG capsule Take 1 capsule (100 mg total) by mouth 2 (two) times daily. 10/20/19   Harlin Heys, MD  omeprazole (PRILOSEC) 40 MG capsule Take 40 mg by mouth 2 (two) times daily.  [provider]  oxyCODONE-acetaminophen (PERCOCET/ROXICET) 5-325 MG tablet Take 1-2 tablets by mouth every 6 (six) hours as needed. Patient not taking: Reported on 10/24/2019 10/16/19   Harlin Heys, MD  predniSONE (DELTASONE) 10 MG tablet Take 4 tablets (40 mg total) by mouth daily for 3 days, THEN 2 tablets (20 mg total) daily for 3 days, THEN 1 tablet (10 mg total) daily for 3 days. 12/06/19 12/15/19  Duffy Bruce, MD  traZODone (DESYREL) 100 MG tablet Take 50 mg by mouth at bedtime.  05/27/15   [provider]  umeclidinium-vilanterol (ANORO ELLIPTA) 62.5-25  MCG/INH AEPB Inhale 1 puff into the lungs daily. 08/04/17   [provider]    Allergies Adhesive [tape]  Family History  Problem Relation Age of Onset  . Coronary artery disease Other        GF  . Hypertension Mother   . Ovarian cancer Mother     Social History Social History   Tobacco Use  . Smoking status: Smoker, Current Status Unknown    Packs/day: 1.00    Years: 37.00    Pack years: 37.00  . Smokeless tobacco: Never Used  Substance Use Topics  . Alcohol use: No  . Drug use: No    Review of Systems  Review of Systems  Constitutional: Positive for chills, fatigue and fever.  HENT: Negative for congestion and sore throat.   Eyes: Negative for visual disturbance.  Respiratory: Positive for cough and shortness of breath.   Cardiovascular: Positive for chest pain.  Gastrointestinal: Negative for abdominal pain, diarrhea, nausea and vomiting.  Genitourinary: Negative for flank pain.  Musculoskeletal: Negative for back pain and neck pain.  Skin: Negative for rash and wound.  Neurological: Positive for weakness.  All other systems reviewed and are negative.    ____________________________________________  PHYSICAL EXAM:      VITAL SIGNS: ED Triage Vitals  Enc Vitals Group     BP 12/06/19 0542 (!) 143/75     Pulse Rate 12/06/19 0542 87     Resp 12/06/19 0542 16     Temp 12/06/19 0542 98.8 F (37.1 C)     Temp Source 12/06/19 0542 Oral     SpO2 12/06/19 0542 96 %     Weight 12/06/19 0534 150 lb (68 kg)     Height 12/06/19 0534 5\' 4"  (1.626 m)     Head Circumference --      Peak Flow --      Pain Score 12/06/19 0534 6     Pain Loc --      Pain Edu? --      Excl. in [place]? --      Physical Exam Vitals and nursing note reviewed.  Constitutional:      General: She is not in acute distress.    Appearance: She is well-developed.  HENT:     Head: Normocephalic and atraumatic.  Eyes:     Conjunctiva/sclera: Conjunctivae normal.  Cardiovascular:      Rate and Rhythm: Normal rate and regular rhythm.     Heart sounds: Normal heart sounds. No murmur. No friction rub.  Pulmonary:     Effort: Pulmonary effort is normal. Tachypnea present. No respiratory distress.     Breath sounds: Decreased breath sounds and wheezing present. No rales.     Comments: Diffuse expiratory wheezes, slightly increased work of breathing but speaking in full sentences Abdominal:     General: There is no distension.     Palpations: Abdomen is  soft.     Tenderness: There is no abdominal tenderness.  Musculoskeletal:     Cervical back: Neck supple.  Skin:    General: Skin is warm.     Capillary Refill: Capillary refill takes less than 2 seconds.  Neurological:     Mental Status: She is alert and oriented to person, place, and time.     Motor: No abnormal muscle tone.       ____________________________________________   LABS (all labs ordered are listed, but only abnormal results are displayed)  Labs Reviewed  NOVEL CORONAVIRUS, NAA (HOSP ORDER, SEND-OUT TO REF LAB; TAT 18-24 HRS)    ____________________________________________  EKG: Normal sinus rhythm, ventricular rate 70.  PR 143, QRS 81, QTc 419.  No acute ST elevations or depressions.  No ischemia or infarct.  ________________________________________  RADIOLOGY All imaging, including plain films, CT scans, and ultrasounds, independently reviewed by me, and interpretations confirmed via formal radiology reads.  ED MD interpretation:   Chest x-ray: Normal, no PNA  Official radiology report(s): DG Chest Portable 1 View  Result Date: 12/06/2019 CLINICAL DATA:  Shortness of breath with cough and fever EXAM: PORTABLE CHEST 1 VIEW COMPARISON:  02/21/2019 FINDINGS: Normal heart size and mediastinal contours. There is no edema, consolidation, effusion, or pneumothorax. No change from prior. No osseous findings. IMPRESSION: Stable chest.  No evidence of pneumonia. Electronically Signed   By: Monte Fantasia M.D.   On: 12/06/2019 07:55    ____________________________________________  PROCEDURES   Procedure(s) performed (including Critical Care):  Procedures  ____________________________________________  INITIAL IMPRESSION / MDM / [place] / ED COURSE  As part of my medical decision making, I reviewed the following data within the [place] notes reviewed and incorporated, Old chart reviewed, Notes from prior ED visits, and [place] Controlled Substance Database       *SHYLEEN RIEKE was evaluated in Emergency Department on 12/06/2019 for the symptoms described in the history of present illness. She was evaluated in the context of the global COVID-19 pandemic, which necessitated consideration that the patient might be at risk for infection with the SARS-CoV-2 virus that causes COVID-19. Institutional protocols and algorithms that pertain to the evaluation of patients at risk for COVID-19 are in a state of rapid change based on information released by regulatory bodies including the CDC and federal and state organizations. These policies and algorithms were followed during the patient's care in the ED.  Some ED evaluations and interventions may be delayed as a result of limited staffing during the pandemic.*     Medical Decision Making: 53 year old very pleasant female here with cough, subjective fevers, body aches, and wheezing.  Differential includes COPD exacerbation versus viral bronchitis versus COVID-19.  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  Clinically, patient appears to feel unwell but is nontoxic and satting well on room air.  Given her underlying COPD, though, I feel it is very reasonable to start on steroids and empiric antibiotics.  No overt sputum production at this time or focal pneumonia on her chest x-ray.  EKG is  nonischemic and I suspect her transient chest pain is likely due to chest wall pain, with no signs of ACS or PE.  Will send an outpatient Covid, encourage hydration and close outpatient follow-up.  ____________________________________________  FINAL CLINICAL IMPRESSION(S) / ED DIAGNOSES  Final diagnoses:  Suspected COVID-19  virus infection  COPD exacerbation ([place])     MEDICATIONS GIVEN DURING THIS VISIT:  Medications  predniSONE (DELTASONE) tablet 60 mg (60 mg Oral Given 12/06/19 0805)  albuterol (VENTOLIN HFA) 108 (90 Base) MCG/ACT inhaler 6 puff (6 puffs Inhalation Given 12/06/19 0825)     ED Discharge Orders         Ordered    azithromycin (ZITHROMAX Z-PAK) 250 MG tablet     12/06/19 0828    predniSONE (DELTASONE) 10 MG tablet     12/06/19 0828    HYDROcodone-homatropine (HYCODAN) 5-1.5 MG/5ML syrup  Every 6 hours PRN     12/06/19 [id number]           Note:  This document was prepared using Dragon voice recognition software and may include unintentional dictation errors.   Duffy Bruce, MD 12/06/19 [phone]

## 2019-12-06 NOTE — Discharge Instructions (Signed)
As we discussed, your symptoms could be due to coronavirus, bronchitis, or your COPD.  We are going to treat all of this with steroids and antibiotics.  Start taking these as prescribed.  Use your albuterol inhaler every 4 hours for the next 48 hours, then as needed for wheezing.  Drink plenty of fluids.  If you develop worsening shortness of breath, particularly at rest, return to the ER for repeat evaluation.

## 2019-12-06 NOTE — ED Triage Notes (Signed)
Pt to the er for covid symptoms. Pt reports headache, body aches, cough, chest tightness, fever x 3 days.

## 2019-12-07 LAB — NOVEL CORONAVIRUS, NAA (HOSP ORDER, SEND-OUT TO REF LAB; TAT 18-24 HRS): SARS-CoV-2, NAA: NOT DETECTED

## 2020-04-08 ENCOUNTER — Ambulatory Visit: Payer: Managed Care, Other (non HMO) | Attending: Internal Medicine

## 2020-04-08 ENCOUNTER — Other Ambulatory Visit: Payer: Self-pay

## 2020-04-08 DIAGNOSIS — Z23 Encounter for immunization: Secondary | ICD-10-CM

## 2020-04-08 NOTE — Progress Notes (Signed)
   Covid-19 Vaccination Clinic  Name:  Amy Ferrell    MRN: [id number] DOB: 1966/08/05  04/08/2020  Amy Ferrell was observed post Covid-19 immunization for 15 minutes .  During the observation period, she experienced an adverse reaction with the following symptoms:  dizziness.  Assessment : Time of assessment 1045. Alert and oriented and patient reports that she has COPD and feels "smothered wearing the mask".  Actions taken:  Vitals sign taken  VAERS form obtained and completed by RN. VS- BP- 117/64, P- 78, O2- 99%, water and crackers given as patient reports that she has not had food today.   Medications administered: No medication administered.  Disposition: Reports no further symptoms of adverse reaction after observation for 30 minutes. Discharged home. The Patient was provided with Vaccine Information Sheet and instruction to access the V-Safe system.    Immunizations Administered    Name Date Dose VIS Date Route   Pfizer COVID-19 Vaccine 04/08/2020 10:25 AM 0.3 mL 02/14/2019 Intramuscular   Manufacturer: [employer], [employer]   Lot: [id number]   [place]: [id number]

## 2020-04-08 NOTE — Progress Notes (Signed)
   Covid-19 Vaccination Clinic  Name:  Amy Ferrell    MRN: [id number] DOB: 1966-06-24  04/08/2020  [name] was observed post Covid-19 immunization for 15 minutes without incident. She was provided with Vaccine Information Sheet and instruction to access the V-Safe system.   [name] was instructed to call 911 with any severe reactions post vaccine: Marland Kitchen Difficulty breathing  . Swelling of face and throat  . A fast heartbeat  . A bad rash all over body  . Dizziness and weakness   Immunizations Administered    Name Date Dose VIS Date Route   Pfizer COVID-19 Vaccine 04/08/2020 10:25 AM 0.3 mL 02/14/2019 Intramuscular   Manufacturer: [employer], [employer]   Lot: [id number]   [place]: [id number]

## 2020-04-11 ENCOUNTER — Telehealth: Payer: Self-pay | Admitting: *Deleted

## 2020-04-11 NOTE — Telephone Encounter (Signed)
Called and spoke with the patient in regards to her COVID vaccine that she received on April 19th. She stated that other than a sore arm and a little bit of nausea that evening she has not had any other symptoms. Advised to patient that her symptoms do not seem to indicate any allergic reaction and that she should be fine getting her second vaccine. Patient verbalized understanding.

## 2020-04-29 ENCOUNTER — Other Ambulatory Visit: Payer: Self-pay | Admitting: Family Medicine

## 2020-04-29 DIAGNOSIS — Z1231 Encounter for screening mammogram for malignant neoplasm of breast: Secondary | ICD-10-CM

## 2020-05-04 ENCOUNTER — Ambulatory Visit: Payer: Managed Care, Other (non HMO) | Attending: Internal Medicine

## 2020-05-04 DIAGNOSIS — Z23 Encounter for immunization: Secondary | ICD-10-CM

## 2020-05-04 NOTE — Progress Notes (Signed)
   Covid-19 Vaccination Clinic  Name:  Amy Ferrell    MRN: [id number] DOB: [birth year]  05/04/2020  [name] was observed post Covid-19 immunization for 15 minutes without incident. She was provided with Vaccine Information Sheet and instruction to access the V-Safe system.   [name] was instructed to call 911 with any severe reactions post vaccine: Marland Kitchen Difficulty breathing  . Swelling of face and throat  . A fast heartbeat  . A bad rash all over body  . Dizziness and weakness   Immunizations Administered    Name Date Dose VIS Date Route   Pfizer COVID-19 Vaccine 05/04/2020 10:18 AM 0.3 mL 02/14/2019 Intramuscular   Manufacturer: [place]   Lot: [id number]   [place]: [id number]

## 2020-05-15 ENCOUNTER — Encounter: Payer: Self-pay | Admitting: *Deleted

## 2020-05-15 NOTE — Progress Notes (Signed)
Abnormal finding noted. PCP notified and requested referral to the lung nodule clinic for further follow up if appropriate. 

## 2020-06-04 ENCOUNTER — Ambulatory Visit: Payer: Managed Care, Other (non HMO) | Admitting: Gastroenterology

## 2020-11-26 ENCOUNTER — Telehealth: Payer: Self-pay

## 2020-11-26 NOTE — Telephone Encounter (Signed)
mychart message sent to patient

## 2020-12-16 ENCOUNTER — Encounter: Payer: Self-pay | Admitting: Emergency Medicine

## 2020-12-16 ENCOUNTER — Emergency Department
Admission: EM | Admit: 2020-12-16 | Discharge: 2020-12-16 | Disposition: A | Discharge status: Home / Self Care | Payer: Managed Care, Other (non HMO) | Attending: Emergency Medicine | Admitting: Emergency Medicine

## 2020-12-16 ENCOUNTER — Emergency Department: Payer: Managed Care, Other (non HMO)

## 2020-12-16 ENCOUNTER — Other Ambulatory Visit: Payer: Self-pay

## 2020-12-16 DIAGNOSIS — J449 Chronic obstructive pulmonary disease, unspecified: Secondary | ICD-10-CM | POA: Insufficient documentation

## 2020-12-16 DIAGNOSIS — Z79899 Other long term (current) drug therapy: Secondary | ICD-10-CM | POA: Insufficient documentation

## 2020-12-16 DIAGNOSIS — I10 Essential (primary) hypertension: Secondary | ICD-10-CM | POA: Insufficient documentation

## 2020-12-16 DIAGNOSIS — Z20822 Contact with and (suspected) exposure to covid-19: Secondary | ICD-10-CM | POA: Insufficient documentation

## 2020-12-16 DIAGNOSIS — Z87891 Personal history of nicotine dependence: Secondary | ICD-10-CM | POA: Insufficient documentation

## 2020-12-16 DIAGNOSIS — J40 Bronchitis, not specified as acute or chronic: Secondary | ICD-10-CM

## 2020-12-16 LAB — RESP PANEL BY RT-PCR (FLU A&B, COVID) ARPGX2
Influenza A by PCR: NEGATIVE
Influenza B by PCR: NEGATIVE
SARS Coronavirus 2 by RT PCR: NEGATIVE

## 2020-12-16 MED ORDER — IPRATROPIUM-ALBUTEROL 0.5-2.5 (3) MG/3ML IN SOLN
3.0000 mL | Freq: Once | RESPIRATORY_TRACT | Status: AC
  Administered 2020-12-16: 10:00:00 3 mL via RESPIRATORY_TRACT
  Filled 2020-12-16: qty 3

## 2020-12-16 MED ORDER — PREDNISONE 10 MG PO TABS: ORAL_TABLET | ORAL | 0 refills | Status: DC

## 2020-12-16 MED ORDER — AZITHROMYCIN 250 MG PO TABS: ORAL_TABLET | ORAL | 0 refills | Status: DC

## 2020-12-16 MED ORDER — METHYLPREDNISOLONE SODIUM SUCC 125 MG IJ SOLR
125.0000 mg | Freq: Once | INTRAMUSCULAR | Status: AC
  Administered 2020-12-16: 10:00:00 125 mg via INTRAMUSCULAR
  Filled 2020-12-16: qty 2

## 2020-12-16 MED ORDER — DEXAMETHASONE 10 MG/ML FOR PEDIATRIC ORAL USE: 10.0000 mg | Freq: Once | INTRAMUSCULAR | Status: DC

## 2020-12-16 MED ORDER — IPRATROPIUM-ALBUTEROL 0.5-2.5 (3) MG/3ML IN SOLN
3.0000 mL | Freq: Once | RESPIRATORY_TRACT | Status: AC
  Administered 2020-12-16: 08:00:00 3 mL via RESPIRATORY_TRACT
  Filled 2020-12-16: qty 3

## 2020-12-16 MED ORDER — IPRATROPIUM-ALBUTEROL 0.5-2.5 (3) MG/3ML IN SOLN
RESPIRATORY_TRACT | Status: AC
  Filled 2020-12-16: qty 3

## 2020-12-16 MED ORDER — ALBUTEROL SULFATE (2.5 MG/3ML) 0.083% IN NEBU: 2.5000 mg | INHALATION_SOLUTION | Freq: Four times a day (QID) | RESPIRATORY_TRACT | 0 refills | Status: DC | PRN

## 2020-12-16 NOTE — ED Provider Notes (Signed)
[facility] Emergency Department Provider Note  ____________________________________________  Time seen: Approximately 9:40 AM  I have reviewed the triage vital signs and the nursing notes.   HISTORY  Chief Complaint Cough    HPI Amy Ferrell is a 54 y.o. female that presents to the emergency department for evaluation of low-grade fever and nonproductive cough for 1 week.  Patient states that it feels like she needs to cough something up but has not been unable to do so.  Patient went to her primary care last Tuesday and was started on doxycycline and steroids.  She just finished her last dose of steroids yesterday.  She was taking prednisone 40 mg, then 20, then 10.  She is still taking the doxycycline.  Her husband is sick with similar symptoms.  Husband states that everyone at his work has been sick.  They have both received 2 doses of Pfizer vaccine.  She uses an albuterol inhaler at home.  She feels like she needs a breathing treatment.  No shortness of breath, chest pain, vomiting, abdominal pain, diarrhea.  Past Medical History:  Diagnosis Date  . Complication of anesthesia    2004 spinal did not work   . COPD (chronic obstructive pulmonary disease) ([place])   . Depression   . Family history of adverse reaction to anesthesia    mom slow to wake up  . GERD (gastroesophageal reflux disease)   . Hypertension   . Ovarian tumor   . Renal disorder    conjoined kidney  . SVT (supraventricular tachycardia) ([place])   . UTI (urinary tract infection)     Patient Active Problem List   Diagnosis Date Noted  . TOBACCO USER 06/27/2010  . CLAUDICATION 06/27/2010  . ELEVATED BLOOD PRESSURE WITHOUT DIAGNOSIS OF HYPERTENSION 06/27/2010  . SUPRAVENTRICULAR TACHYCARDIA 06/19/2010  . CHEST PAIN UNSPECIFIED 06/19/2010    Past Surgical History:  Procedure Laterality Date  . ABDOMINAL HYSTERECTOMY    . appebdectomy    . APPENDECTOMY    . CESAREAN SECTION     x3    . CHOLECYSTECTOMY    . LAPAROSCOPIC SALPINGO OOPHERECTOMY Right 10/16/2019   Procedure: LAPAROSCOPIC SALPINGO OOPHORECTOMY;  Surgeon: Harlin Heys, MD;  Location: [facility] ORS;  Service: Gynecology;  Laterality: Right;  Marland Kitchen VESICOVAGINAL FISTULA CLOSURE W/ TAH      Prior to Admission medications   Medication Sig Start Date End Date Taking? Authorizing Provider  albuterol (PROVENTIL) (2.5 MG/3ML) 0.083% nebulizer solution Take 3 mLs (2.5 mg total) by nebulization every 6 (six) hours as needed for wheezing or shortness of breath. 12/16/20  Yes Laban Emperor, PA-C  azithromycin (ZITHROMAX Z-PAK) 250 MG tablet Take 2 tablets (500 mg) on  Day 1,  followed by 1 tablet (250 mg) once daily on Days 2 through 5. 12/16/20  Yes Laban Emperor, PA-C  predniSONE (DELTASONE) 10 MG tablet Take 6 tablets on day 1, take 5 tablets on day 2, take 4 tablets on day 3, take 3 tablets on day 4, take 2 tablets on day 5, take 1 tablet on day 6 12/17/20  Yes Laban Emperor, PA-C  acetaminophen (TYLENOL) 325 MG tablet Take 650 mg by mouth every 6 (six) hours as needed.      [provider]  amLODipine (NORVASC) 5 MG tablet Take 5 mg by mouth daily.    [provider]  HYDROcodone-homatropine (HYCODAN) 5-1.5 MG/5ML syrup Take 5 mLs by mouth every 6 (six) hours as needed for cough. 12/06/19   Ellender Hose,  Lysbeth Galas, MD  nitrofurantoin, macrocrystal-monohydrate, (MACROBID) 100 MG capsule Take 1 capsule (100 mg total) by mouth 2 (two) times daily. 10/20/19   Harlin Heys, MD  omeprazole (PRILOSEC) 40 MG capsule Take 40 mg by mouth 2 (two) times daily.      [provider]  oxyCODONE-acetaminophen (PERCOCET/ROXICET) 5-325 MG tablet Take 1-2 tablets by mouth every 6 (six) hours as needed. Patient not taking: Reported on 10/24/2019 10/16/19   Harlin Heys, MD  traZODone (DESYREL) 100 MG tablet Take 50 mg by mouth at bedtime.  05/27/15   [provider]  umeclidinium-vilanterol (ANORO  ELLIPTA) 62.5-25 MCG/INH AEPB Inhale 1 puff into the lungs daily. 08/04/17   [provider]    Allergies Adhesive [tape]  Family History  Problem Relation Age of Onset  . Coronary artery disease Other        GF  . Hypertension Mother   . Ovarian cancer Mother     Social History Social History   Tobacco Use  . Smoking status: Former Smoker    Packs/day: 1.00    Years: 37.00    Pack years: 37.00    Quit date: 11/16/2020    Years since quitting: 0.0  . Smokeless tobacco: Never Used  Vaping Use  . Vaping Use: Never used  Substance Use Topics  . Alcohol use: No  . Drug use: No     Review of Systems  Constitutional: Positive for low grade fever. Eyes: No visual changes. No discharge. ENT: Negative for congestion and rhinorrhea. Cardiovascular: No chest pain. Respiratory: Positive for cough. No SOB. Gastrointestinal: No abdominal pain.  No nausea, no vomiting.  No diarrhea.  No constipation. Musculoskeletal: Negative for musculoskeletal pain. Skin: Negative for rash, abrasions, lacerations, ecchymosis. Neurological: Negative for headaches.   ____________________________________________   PHYSICAL EXAM:  VITAL SIGNS: ED Triage Vitals  Enc Vitals Group     BP 12/16/20 0804 (!) 161/85     Pulse Rate 12/16/20 0804 84     Resp 12/16/20 0804 18     Temp 12/16/20 0804 99.1 F (37.3 C)     Temp Source 12/16/20 0804 Oral     SpO2 12/16/20 0804 98 %     Weight 12/16/20 0802 149 lb 14.6 oz (68 kg)     Height 12/16/20 0802 5\' 4"  (1.626 m)     Head Circumference --      Peak Flow --      Pain Score 12/16/20 0802 0     Pain Loc --      Pain Edu? --      Excl. in [place]? --      Constitutional: Alert and oriented. Well appearing and in no acute distress. Eyes: Conjunctivae are normal. PERRL. EOMI. No discharge. Head: Atraumatic. ENT: No frontal and maxillary sinus tenderness.      Ears: Tympanic membranes pearly gray with good landmarks. No discharge.       Nose: Mild congestion/rhinnorhea.      Mouth/Throat: Mucous membranes are moist. Oropharynx non-erythematous. Tonsils not enlarged. No exudates. Uvula midline. Neck: No stridor.   Hematological/Lymphatic/Immunilogical: No cervical lymphadenopathy. Cardiovascular: Normal rate, regular rhythm.  Good peripheral circulation. Respiratory: Normal respiratory effort without tachypnea or retractions.  Scattered wheezes. Good air entry to the bases with no decreased or absent breath sounds. Gastrointestinal: Bowel sounds 4 quadrants. Soft and nontender to palpation. No guarding or rigidity. No palpable masses. No distention. Musculoskeletal: Full range of motion to all extremities. No gross deformities appreciated. Neurologic:  Normal speech and language. No gross focal neurologic deficits are appreciated.  Skin:  Skin is warm, dry and intact. No rash noted. Psychiatric: Mood and affect are normal. Speech and behavior are normal. Patient exhibits appropriate insight and judgement.   ____________________________________________   LABS (all labs ordered are listed, but only abnormal results are displayed)  Labs Reviewed  RESP PANEL BY RT-PCR (FLU A&B, COVID) [id number]   ____________________________________________  EKG   ____________________________________________  RADIOLOGY Lexine Baton, personally viewed and evaluated these images (plain radiographs) as part of my medical decision making, as well as reviewing the written report by the radiologist.  DG Chest 1 View  Result Date: 12/16/2020 CLINICAL DATA:  Cough EXAM: CHEST  1 VIEW COMPARISON:  Chest radiograph December 06, 2019. CT chest February 21, 2019. FINDINGS: The heart size and mediastinal contours are within normal limits. Nodular opacity in the right lateral lung likely correlates with the pulmonary nodule that was better characterized on prior CT chest from February 21, 2019. No consolidation. No visible pleural effusions or  pneumothorax. IMPRESSION: 1. No evidence of acute cardiopulmonary disease. 2. Nodular opacity in the right lateral lung likely correlates with the pulmonary nodule that was better characterized on prior CT chest from February 21, 2019. Please see that study for follow-up recommendations. Electronically Signed   By: Feliberto Harts MD   On: 12/16/2020 08:39    ____________________________________________    PROCEDURES  Procedure(s) performed:    Procedures    Medications  ipratropium-albuterol (DUONEB) 0.5-2.5 (3) MG/3ML nebulizer solution 3 mL (3 mLs Nebulization Given 12/16/20 0820)  ipratropium-albuterol (DUONEB) 0.5-2.5 (3) MG/3ML nebulizer solution 3 mL (3 mLs Nebulization Given 12/16/20 0950)  methylPREDNISolone sodium succinate (SOLU-MEDROL) 125 mg/2 mL injection 125 mg (125 mg Intramuscular Given 12/16/20 0948)     ____________________________________________   INITIAL IMPRESSION / ASSESSMENT AND PLAN / ED COURSE  Pertinent labs & imaging results that were available during my care of the patient were reviewed by me and considered in my medical decision making (see chart for details).  Review of the [place] CSRS was performed in accordance of the [facility] prior to dispensing any controlled drugs.   Patient's diagnosis is consistent with bronchitis. Vital signs and exam are reassuring.  Vital signs and exam are reassuring.  Covid and influenza tests are negative.  Chest x-ray negative for acute cardiopulmonary processes.  Chest x-ray shows a pulmonary nodule that was present on previous CT scan.  Patient is aware of his pulmonary nodule and states that she has had several for many many years.  She does follow with a specialist for these.  Patient does have wheezing to all lung fields on exam.  She was given 2 DuoNeb treatments and a dose of IM steroids in the emergency department.  She feels much improved after medications.  I suspect that she needs a higher dose of oral prednisone at  home.  She is requesting discharge.Patient will be discharged home with prescriptions for azithromycin,  Prednisone, and albuterol nebulizer solution. Patient is to follow up with primary care as needed or otherwise directed. Patient is given ED precautions to return to the ED for any worsening or new symptoms.  Amy Ferrell was evaluated in Emergency Department on 12/16/2020 for the symptoms described in the history of present illness. She was evaluated in the context of the global COVID-19 pandemic, which necessitated consideration that the patient might be at risk for infection with the SARS-CoV-2 virus that causes COVID-19. Institutional protocols and  algorithms that pertain to the evaluation of patients at risk for COVID-19 are in a state of rapid change based on information released by regulatory bodies including the CDC and federal and state organizations. These policies and algorithms were followed during the patient's care in the ED.   ____________________________________________  FINAL CLINICAL IMPRESSION(S) / ED DIAGNOSES  Final diagnoses:  Bronchitis      NEW MEDICATIONS STARTED DURING THIS VISIT:  ED Discharge Orders         Ordered    predniSONE (DELTASONE) 10 MG tablet        12/16/20 1103    albuterol (PROVENTIL) (2.5 MG/3ML) 0.083% nebulizer solution  Every 6 hours PRN        12/16/20 1103    azithromycin (ZITHROMAX Z-PAK) 250 MG tablet        12/16/20 1103    For home use only DME Nebulizer machine        12/16/20 1104              This chart was dictated using voice recognition software/Dragon. Despite best efforts to proofread, errors can occur which can change the meaning. Any change was purely unintentional.    Laban Emperor, PA-C 12/16/20 1525    Lucrezia Starch, MD 12/16/20 2154

## 2020-12-16 NOTE — ED Triage Notes (Signed)
C?O cough, chills x 6 days.  Seen at clinic on Tuesday and started on Doxycycline and prednisone.  Patient states finished prednisone yesterday.  C/O cough, but unable to expectorate sputum.  AAOx3.  Skin warm and dry. NAD.  Strong cough noted in Triage.

## 2021-01-01 ENCOUNTER — Other Ambulatory Visit: Payer: Self-pay | Admitting: Primary Care

## 2021-01-01 DIAGNOSIS — N6321 Unspecified lump in the left breast, upper outer quadrant: Secondary | ICD-10-CM

## 2021-01-02 ENCOUNTER — Other Ambulatory Visit: Payer: Self-pay | Admitting: Primary Care

## 2021-01-02 DIAGNOSIS — N6321 Unspecified lump in the left breast, upper outer quadrant: Secondary | ICD-10-CM

## 2021-01-09 ENCOUNTER — Ambulatory Visit
Admission: RE | Admit: 2021-01-09 | Discharge: 2021-01-09 | Disposition: A | Discharge status: Home / Self Care | Payer: Self-pay | Source: Ambulatory Visit | Attending: Primary Care | Admitting: Primary Care

## 2021-01-09 ENCOUNTER — Other Ambulatory Visit: Payer: Self-pay

## 2021-01-09 DIAGNOSIS — N6321 Unspecified lump in the left breast, upper outer quadrant: Secondary | ICD-10-CM

## 2021-12-08 ENCOUNTER — Ambulatory Visit: Payer: Self-pay | Admitting: Nurse Practitioner

## 2022-11-19 ENCOUNTER — Ambulatory Visit: Payer: Self-pay | Admitting: Internal Medicine

## 2022-11-25 DIAGNOSIS — R079 Chest pain, unspecified: Secondary | ICD-10-CM | POA: Diagnosis not present

## 2022-12-29 ENCOUNTER — Ambulatory Visit: Payer: Self-pay | Admitting: Internal Medicine

## 2023-01-12 ENCOUNTER — Encounter: Payer: Self-pay | Admitting: *Deleted

## 2023-01-14 ENCOUNTER — Ambulatory Visit: Payer: Self-pay | Admitting: Internal Medicine

## 2023-01-18 ENCOUNTER — Emergency Department: Payer: Self-pay

## 2023-01-18 ENCOUNTER — Emergency Department
Admission: EM | Admit: 2023-01-18 | Discharge: 2023-01-19 | Discharge status: Against Medical Advice | Payer: Self-pay | Attending: Emergency Medicine | Admitting: Emergency Medicine

## 2023-01-18 ENCOUNTER — Other Ambulatory Visit: Payer: Self-pay

## 2023-01-18 ENCOUNTER — Encounter: Payer: Self-pay | Admitting: Emergency Medicine

## 2023-01-18 DIAGNOSIS — Z1152 Encounter for screening for COVID-19: Secondary | ICD-10-CM | POA: Insufficient documentation

## 2023-01-18 DIAGNOSIS — Z7951 Long term (current) use of inhaled steroids: Secondary | ICD-10-CM | POA: Insufficient documentation

## 2023-01-18 DIAGNOSIS — I10 Essential (primary) hypertension: Secondary | ICD-10-CM | POA: Insufficient documentation

## 2023-01-18 DIAGNOSIS — J441 Chronic obstructive pulmonary disease with (acute) exacerbation: Secondary | ICD-10-CM | POA: Insufficient documentation

## 2023-01-18 DIAGNOSIS — R0602 Shortness of breath: Secondary | ICD-10-CM | POA: Insufficient documentation

## 2023-01-18 DIAGNOSIS — Z8543 Personal history of malignant neoplasm of ovary: Secondary | ICD-10-CM | POA: Insufficient documentation

## 2023-01-18 DIAGNOSIS — R079 Chest pain, unspecified: Secondary | ICD-10-CM

## 2023-01-18 DIAGNOSIS — E876 Hypokalemia: Secondary | ICD-10-CM | POA: Insufficient documentation

## 2023-01-18 DIAGNOSIS — I4729 Other ventricular tachycardia: Secondary | ICD-10-CM | POA: Insufficient documentation

## 2023-01-18 DIAGNOSIS — Z79899 Other long term (current) drug therapy: Secondary | ICD-10-CM | POA: Insufficient documentation

## 2023-01-18 LAB — BASIC METABOLIC PANEL
Anion gap: 11 (ref 5–15)
BUN: 12 mg/dL (ref 6–20)
CO2: 24 mmol/L (ref 22–32)
Calcium: 9.8 mg/dL (ref 8.9–10.3)
Chloride: 104 mmol/L (ref 98–111)
Creatinine, Ser: 0.7 mg/dL (ref 0.44–1.00)
GFR, Estimated: >60 mL/min (ref >60)
Glucose, Bld: 173 mg/dL — ABNORMAL HIGH (ref 70–99)
Potassium: 3.2 mmol/L — ABNORMAL LOW (ref 3.5–5.1)
Sodium: 139 mmol/L (ref 135–145)

## 2023-01-18 LAB — CBC
HCT: 45.8 % (ref 36.0–46.0)
Hemoglobin: 15.5 g/dL — ABNORMAL HIGH (ref 12.0–15.0)
MCH: 33.2 pg (ref 26.0–34.0)
MCHC: 33.8 g/dL (ref 30.0–36.0)
MCV: 98.1 fL (ref 80.0–100.0)
Platelets: 489 10*3/uL — ABNORMAL HIGH (ref 150–400)
RBC: 4.67 MIL/uL (ref 3.87–5.11)
RDW: 12.5 % (ref 11.5–15.5)
WBC: 11.1 10*3/uL — ABNORMAL HIGH (ref 4.0–10.5)
nRBC: 0 % (ref 0.0–0.2)

## 2023-01-18 LAB — HEPATIC FUNCTION PANEL
ALT: 15 U/L (ref 0–44)
AST: 27 U/L (ref 15–41)
Albumin: 4.5 g/dL (ref 3.5–5.0)
Alkaline Phosphatase: 129 U/L — ABNORMAL HIGH (ref 38–126)
Bilirubin, Direct: <0.1 mg/dL (ref 0.0–0.2)
Total Bilirubin: 0.5 mg/dL (ref 0.3–1.2)
Total Protein: 9.1 g/dL — ABNORMAL HIGH (ref 6.5–8.1)

## 2023-01-18 LAB — LIPASE, BLOOD: Lipase: 36 U/L (ref 11–51)

## 2023-01-18 LAB — TROPONIN I (HIGH SENSITIVITY): Troponin I (High Sensitivity): 6 ng/L (ref <18)

## 2023-01-18 LAB — BRAIN NATRIURETIC PEPTIDE: B Natriuretic Peptide: 114.6 pg/mL — ABNORMAL HIGH (ref 0.0–100.0)

## 2023-01-18 MED ORDER — METHYLPREDNISOLONE SODIUM SUCC 125 MG IJ SOLR
125.0000 mg | Freq: Once | INTRAMUSCULAR | Status: AC
  Administered 2023-01-18: 125 mg via INTRAVENOUS
  Filled 2023-01-18: qty 2

## 2023-01-18 MED ORDER — SODIUM CHLORIDE 0.9 % IV BOLUS
500.0000 mL | Freq: Once | INTRAVENOUS | Status: AC
  Administered 2023-01-18: 500 mL via INTRAVENOUS

## 2023-01-18 MED ORDER — IPRATROPIUM-ALBUTEROL 0.5-2.5 (3) MG/3ML IN SOLN
3.0000 mL | Freq: Once | RESPIRATORY_TRACT | Status: AC
  Administered 2023-01-18: 3 mL via RESPIRATORY_TRACT
  Filled 2023-01-18: qty 3

## 2023-01-18 MED ORDER — LORAZEPAM 2 MG/ML IJ SOLN
0.5000 mg | Freq: Once | INTRAMUSCULAR | Status: AC
  Administered 2023-01-18: 0.5 mg via INTRAVENOUS
  Filled 2023-01-18: qty 1

## 2023-01-18 MED ORDER — FAMOTIDINE IN NACL 20-0.9 MG/50ML-% IV SOLN
20.0000 mg | Freq: Once | INTRAVENOUS | Status: AC
  Administered 2023-01-18: 20 mg via INTRAVENOUS
  Filled 2023-01-18: qty 50

## 2023-01-18 NOTE — ED Provider Notes (Signed)
[facility] Provider Note    Event Date/Time   First MD Initiated Contact with Patient 01/18/23 2308     (approximate)   History   Shortness of Breath and Chest Pain   HPI  Amy Ferrell is a 57 y.o. female who presents to the ED from home with a 3-day history of chest heaviness, anxiety and shortness of breath, worse today.  Felt better after nebulizer treatment at home but then had to use another nebulizer treatment within a short amount of time.  Endorses dry cough and stress.  Denies fever/chills, abdominal pain, nausea, vomiting or dizziness.  Took a leftover prednisone today.  Works from home.  Denies recent travel, trauma or hormone use.     Past Medical History   Past Medical History:  Diagnosis Date   Complication of anesthesia    2004 spinal did not work    COPD (chronic obstructive pulmonary disease) ([place])    Depression    Family history of adverse reaction to anesthesia    mom slow to wake up   GERD (gastroesophageal reflux disease)    Hypertension    Ovarian tumor    Renal disorder    conjoined kidney   SVT (supraventricular tachycardia)    UTI (urinary tract infection)      Active Problem List   Patient Active Problem List   Diagnosis Date Noted   TOBACCO USER 06/27/2010   CLAUDICATION 06/27/2010   ELEVATED BLOOD PRESSURE WITHOUT DIAGNOSIS OF HYPERTENSION 06/27/2010   SUPRAVENTRICULAR TACHYCARDIA 06/19/2010   CHEST PAIN UNSPECIFIED 06/19/2010     Past Surgical History   Past Surgical History:  Procedure Laterality Date   ABDOMINAL HYSTERECTOMY     appebdectomy     APPENDECTOMY     CESAREAN SECTION     x3    CHOLECYSTECTOMY     LAPAROSCOPIC SALPINGO OOPHERECTOMY Right 10/16/2019   Procedure: LAPAROSCOPIC SALPINGO OOPHORECTOMY;  Surgeon: Harlin Heys, MD;  Location: [facility] ORS;  Service: Gynecology;  Laterality: Right;   VESICOVAGINAL FISTULA CLOSURE W/ TAH       Home Medications   Prior to Admission  medications   Medication Sig Start Date End Date Taking? Authorizing Provider  albuterol (PROVENTIL) (2.5 MG/3ML) 0.083% nebulizer solution Take 3 mLs (2.5 mg total) by nebulization every 4 (four) hours as needed for wheezing or shortness of breath. 01/19/23  Yes Paulette Blanch, MD  LORazepam (ATIVAN) 1 MG tablet Take 1 tablet (1 mg total) by mouth every 8 (eight) hours as needed for anxiety. 01/19/23  Yes Paulette Blanch, MD  predniSONE (DELTASONE) 20 MG tablet 3 tablets daily x 4 days 01/19/23  Yes Paulette Blanch, MD  acetaminophen (TYLENOL) 325 MG tablet Take 650 mg by mouth every 6 (six) hours as needed.      [provider]  albuterol (PROVENTIL) (2.5 MG/3ML) 0.083% nebulizer solution Take 3 mLs (2.5 mg total) by nebulization every 6 (six) hours as needed for wheezing or shortness of breath. 12/16/20   Laban Emperor, PA-C  amLODipine (NORVASC) 5 MG tablet Take 5 mg by mouth daily.    [provider]  azithromycin (ZITHROMAX Z-PAK) 250 MG tablet Take 2 tablets (500 mg) on  Day 1,  followed by 1 tablet (250 mg) once daily on Days 2 through 5. 12/16/20   Laban Emperor, PA-C  HYDROcodone-homatropine [facility]) 5-1.5 MG/5ML syrup Take 5 mLs by mouth every 6 (six) hours as needed for cough. 12/06/19   Duffy Bruce,  MD  nitrofurantoin, macrocrystal-monohydrate, (MACROBID) 100 MG capsule Take 1 capsule (100 mg total) by mouth 2 (two) times daily. 10/20/19   Harlin Heys, MD  omeprazole (PRILOSEC) 40 MG capsule Take 40 mg by mouth 2 (two) times daily.      [provider]  oxyCODONE-acetaminophen (PERCOCET/ROXICET) 5-325 MG tablet Take 1-2 tablets by mouth every 6 (six) hours as needed. Patient not taking: Reported on 10/24/2019 10/16/19   Harlin Heys, MD  traZODone (DESYREL) 100 MG tablet Take 50 mg by mouth at bedtime.  05/27/15   [provider]  umeclidinium-vilanterol (ANORO ELLIPTA) 62.5-25 MCG/INH AEPB Inhale 1 puff into the lungs daily. 08/04/17   [provider]     Allergies  Adhesive [tape]   Family History   Family History  Problem Relation Age of Onset   Coronary artery disease Other        GF   Hypertension Mother    Ovarian cancer Mother    Breast cancer Neg Hx      Physical Exam  Triage Vital Signs: ED Triage Vitals  Enc Vitals Group     BP 01/18/23 2232 (!) 193/94     Pulse Rate 01/18/23 2232 (!) 135     Resp 01/18/23 2232 (!) 24     Temp 01/18/23 2232 98.2 F (36.8 C)     Temp Source 01/18/23 2232 Oral     SpO2 01/18/23 2232 97 %     Weight 01/18/23 2233 155 lb (70.3 kg)     Height --      Head Circumference --      Peak Flow --      Pain Score 01/18/23 2233 8     Pain Loc --      Pain Edu? --      Excl. in [place]? --     Updated Vital Signs: BP (!) 159/107   Pulse 97   Temp 98.2 F (36.8 C) (Oral)   Resp 16   Wt 70.3 kg   SpO2 97%   BMI 26.61 kg/m    General: Awake, mild distress.  Anxious. CV:  Tachycardic.  Good peripheral perfusion.  Resp:  Normal effort.  Diminished, otherwise CTAB. Abd:  Nontender.  No distention.  Other:  Bilateral calves are supple and nontender.   ED Results / Procedures / Treatments  Labs (all labs ordered are listed, but only abnormal results are displayed) Labs Reviewed  BASIC METABOLIC PANEL - Abnormal; Notable for the following components:      Result Value   Potassium 3.2 (*)    Glucose, Bld 173 (*)    All other components within normal limits  CBC - Abnormal; Notable for the following components:   WBC 11.1 (*)    Hemoglobin 15.5 (*)    Platelets 489 (*)    All other components within normal limits  HEPATIC FUNCTION PANEL - Abnormal; Notable for the following components:   Total Protein 9.1 (*)    Alkaline Phosphatase 129 (*)    All other components within normal limits  BRAIN NATRIURETIC PEPTIDE - Abnormal; Notable for the following components:   B Natriuretic Peptide 114.6 (*)    All other components within normal limits  D-DIMER,  QUANTITATIVE - Abnormal; Notable for the following components:   D-Dimer, Quant 0.64 (*)    All other components within normal limits  RESP PANEL BY RT-PCR (RSV, FLU A&B, COVID)  [id number]  LIPASE, BLOOD  MAGNESIUM  TROPONIN I (HIGH SENSITIVITY)  TROPONIN I (HIGH SENSITIVITY)     EKG  ED ECG REPORT I, Kentavious Michele J, the attending physician, personally viewed and interpreted this ECG.   Date: 01/19/2023  EKG Time: 2236  Rate: 114  Rhythm: sinus tachycardia  Axis: Normal  Intervals:none  ST&T Change: Nonspecific    RADIOLOGY I have independently visualized and interpreted patient's chest x-ray as well as noted the radiology interpretation:  Chest x-ray: Chronic changes; no acute cardiopulmonary process  CTA chest: No PE; pulmonary nodules  Official radiology report(s): CT Angio Chest PE W/Cm &/Or Wo Cm  Result Date: 01/19/2023 CLINICAL DATA:  Chest pain and shortness of breath for 2 days, initial encounter EXAM: CT ANGIOGRAPHY CHEST WITH CONTRAST TECHNIQUE: Multidetector CT imaging of the chest was performed using the standard protocol during bolus administration of intravenous contrast. Multiplanar CT image reconstructions and MIPs were obtained to evaluate the vascular anatomy. RADIATION DOSE REDUCTION: This exam was performed according to the departmental dose-optimization program which includes automated exposure control, adjustment of the mA and/or kV according to patient size and/or use of iterative reconstruction technique. CONTRAST:  17m OMNIPAQUE IOHEXOL 350 MG/ML SOLN COMPARISON:  Chest x-ray from the previous day, CT from 02/21/2019. FINDINGS: Cardiovascular: Thoracic aorta and its branches demonstrate atherosclerotic calcifications without aneurysmal dilatation or dissection. No cardiac enlargement is seen. No significant coronary calcifications are noted. Pulmonary artery shows a normal branching pattern. No filling defect to suggest pulmonary embolism is noted.  Mediastinum/Nodes: Thoracic inlet is within normal limits. No hilar or mediastinal adenopathy is noted. The esophagus as visualized is within normal limits. Lungs/Pleura: Lungs are well aerated bilaterally. No focal infiltrate or sizable effusion is noted. There is a 7 mm ground-glass nodule identified in the left upper lobe best seen on image number 71 of series 5. Subpleural solid nodules are noted in the right middle lobe measuring up to 6 mm. These are stable in appearance from the prior exam. No further follow-up is recommended. Emphysematous changes are noted. Upper Abdomen: Visualized upper abdomen shows changes of prior cholecystectomy. Scarring in the upper pole of the left kidney is noted and stable. Musculoskeletal: No acute rib abnormality is noted. Degenerative changes of the thoracic spine are noted. Review of the MIP images confirms the above findings. IMPRESSION: No evidence of pulmonary emboli. Subpleural nodules in the right middle lobe measuring up to 6 mm. These are stable in appearance from prior exam from 2020 and considered benign in etiology. 7 mm ground-glass nodule in the left upper lobe. Initial follow-up with CT at 6 months is recommended to confirm persistence. If persistent, repeat CT is recommended every 2 years until 5 years of stability has been established. This recommendation follows the consensus statement: Guidelines for Management of Incidental Pulmonary Nodules Detected on CT Images: From the Fleischner Society 2017; Radiology 2017; 284:228-243. Aortic Atherosclerosis (ICD10-I70.0) and Emphysema (ICD10-J43.9). Electronically Signed   By: MInez CatalinaM.D.   On: 01/19/2023 01:18   DG Chest 2 View  Result Date: 01/18/2023 CLINICAL DATA:  Chest heaviness with anxiety and shortness of breath. EXAM: CHEST - 2 VIEW COMPARISON:  December 16, 2020 FINDINGS: The heart size and mediastinal contours are within normal limits. Stable, diffuse, mild to moderate severity chronic appearing  increased lung markings are seen. There is no evidence of an acute infiltrate, pleural effusion or pneumothorax. Radiopaque surgical clips are seen within the right upper quadrant. The visualized skeletal structures are unremarkable. IMPRESSION: Chronic appearing increased lung markings without evidence of acute or active cardiopulmonary  disease. Electronically Signed   By: Virgina Norfolk M.D.   On: 01/18/2023 22:56     PROCEDURES:  Critical Care performed: Yes, see critical care procedure note(s)  CRITICAL CARE Performed by: Paulette Blanch   Total critical care time: 30 minutes  Critical care time was exclusive of separately billable procedures and treating other patients.  Critical care was necessary to treat or prevent imminent or life-threatening deterioration.  Critical care was time spent personally by me on the following activities: development of treatment plan with patient and/or surrogate as well as nursing, discussions with consultants, evaluation of patient's response to treatment, examination of patient, obtaining history from patient or surrogate, ordering and performing treatments and interventions, ordering and review of laboratory studies, ordering and review of radiographic studies, pulse oximetry and re-evaluation of patient's condition.   Marland Kitchen1-3 Lead EKG Interpretation  Performed by: Paulette Blanch, MD Authorized by: Paulette Blanch, MD     Interpretation: abnormal     ECG rate:  110   ECG rate assessment: tachycardic     Rhythm: sinus tachycardia     Ectopy: none     Conduction: normal   Comments:     Patient placed on cardiac monitor to evaluate for arrhythmias    MEDICATIONS ORDERED IN ED: Medications  LORazepam (ATIVAN) tablet 1 mg (has no administration in time range)  methylPREDNISolone sodium succinate (SOLU-MEDROL) 125 mg/2 mL injection 125 mg (125 mg Intravenous Given 01/18/23 2350)  ipratropium-albuterol (DUONEB) 0.5-2.5 (3) MG/3ML nebulizer solution 3 mL  (3 mLs Nebulization Given 01/18/23 2355)  LORazepam (ATIVAN) injection 0.5 mg (0.5 mg Intravenous Given 01/18/23 2351)  famotidine (PEPCID) IVPB 20 mg premix (0 mg Intravenous Stopped 01/19/23 0024)  sodium chloride 0.9 % bolus 500 mL (0 mLs Intravenous Stopped 01/19/23 0054)  potassium chloride SA (KLOR-CON M) CR tablet 40 mEq (40 mEq Oral Given 01/19/23 0030)  iohexol (OMNIPAQUE) 350 MG/ML injection 75 mL (75 mLs Intravenous Contrast Given 01/19/23 0059)     IMPRESSION / MDM / ASSESSMENT AND PLAN / ED COURSE  I reviewed the triage vital signs and the nursing notes.                             57 year old female presenting with chest pain and shortness of breath. Differential includes, but is not limited to, viral syndrome, bronchitis including COPD exacerbation, pneumonia, reactive airway disease including asthma, CHF including exacerbation with or without pulmonary/interstitial edema, pneumothorax, ACS, thoracic trauma, and pulmonary embolism.  I have personally reviewed patient's records and note her last medical visit was in 2021 for bronchitis.  Patient's presentation is most consistent with acute presentation with potential threat to life or bodily function.  The patient is on the cardiac monitor to evaluate for evidence of arrhythmia and/or significant heart rate changes.  Laboratory results demonstrate minimal leukocytosis WBC 11, mild hypokalemia potassium 3.2, initial troponin is negative.  Chest x-ray without acute changes.  Will obtain respiratory panel, repeat troponin and D-dimer.  Administer IV Solu-Medrol and DuoNeb, replete potassium orally, administer IV Pepcid for sensation of reflux.  Add low-dose IV Ativan for anxiety.  Will reassess.  Clinical Course as of 01/19/23 0231  Tue Jan 19, 2023  0043 Patient noted to have nonsustained 6 beat run of V. tach. [JS]  0229 Patient is hysterically sobbing, she has gotten dressed and refuses to be hospitalized.  Updated patient and family  member of CT results.  She has  known pulmonary nodules.  States she has no insurance and she is stressed out about finances; also her mother died in this hospital so she is afraid to stay.  After long discussion, will place urgent cardiology referral, provide prescriptions for as needed Ativan, prednisone burst and albuterol nebs.  Patient is leaving [place].  We discussed risks of leaving including death and permanent disability.  Her family member is also trying to convince her to stay.  Ultimately patient declines to stay but knows she is welcome back at any time if she changes her mind or suffers recurrent/worsening symptoms.  Return precautions given.  Patient verbalizes understanding and agrees with plan of care. [JS]    Clinical Course User Index [JS] Paulette Blanch, MD     FINAL CLINICAL IMPRESSION(S) / ED DIAGNOSES   Final diagnoses:  Hypokalemia  COPD exacerbation ([place])  Nonsustained ventricular tachycardia ([facility])  Chest pain, unspecified type     Rx / DC Orders   ED Discharge Orders          Ordered    LORazepam (ATIVAN) 1 MG tablet  Every 8 hours PRN        01/19/23 0218    predniSONE (DELTASONE) 20 MG tablet        01/19/23 0218    albuterol (PROVENTIL) (2.5 MG/3ML) 0.083% nebulizer solution  Every 4 hours PRN        01/19/23 0218    Ambulatory referral to Cardiology       Comments: If you have not heard from the Cardiology office within the next 72 hours please call [phone].   01/19/23 0219             Note:  This document was prepared using Dragon voice recognition software and may include unintentional dictation errors.   Paulette Blanch, MD 01/19/23 [phone]

## 2023-01-18 NOTE — ED Triage Notes (Signed)
Pt in with chest heaviness, anxiety and sob since yesterday morning. States she "hasn't felt herself for awhile", feels like something is sitting on her chest. Tearful in triage, states she has been under a lot of stress. Denies any n/v, +cough. States she is taking leftover Prednisone at home

## 2023-01-19 ENCOUNTER — Emergency Department: Payer: Self-pay

## 2023-01-19 LAB — RESP PANEL BY RT-PCR (RSV, FLU A&B, COVID)  RVPGX2
Influenza A by PCR: NEGATIVE
Influenza B by PCR: NEGATIVE
Resp Syncytial Virus by PCR: NEGATIVE
SARS Coronavirus 2 by RT PCR: NEGATIVE

## 2023-01-19 LAB — TROPONIN I (HIGH SENSITIVITY): Troponin I (High Sensitivity): 5 ng/L (ref <18)

## 2023-01-19 LAB — MAGNESIUM: Magnesium: 1.9 mg/dL (ref 1.7–2.4)

## 2023-01-19 LAB — D-DIMER, QUANTITATIVE: D-Dimer, Quant: 0.64 ug/mL-FEU — ABNORMAL HIGH (ref 0.00–0.50)

## 2023-01-19 MED ORDER — ALBUTEROL SULFATE (2.5 MG/3ML) 0.083% IN NEBU: 2.5000 mg | INHALATION_SOLUTION | RESPIRATORY_TRACT | 0 refills | Status: DC | PRN

## 2023-01-19 MED ORDER — LORAZEPAM 1 MG PO TABS: 1.0000 mg | ORAL_TABLET | Freq: Three times a day (TID) | ORAL | 0 refills | Status: DC | PRN

## 2023-01-19 MED ORDER — PREDNISONE 20 MG PO TABS: ORAL_TABLET | ORAL | 0 refills | Status: DC

## 2023-01-19 MED ORDER — LORAZEPAM 1 MG PO TABS
1.0000 mg | ORAL_TABLET | Freq: Once | ORAL | Status: AC
  Administered 2023-01-19: 1 mg via ORAL
  Filled 2023-01-19: qty 1

## 2023-01-19 MED ORDER — IOHEXOL 350 MG/ML SOLN
75.0000 mL | Freq: Once | INTRAVENOUS | Status: AC | PRN
  Administered 2023-01-19: 75 mL via INTRAVENOUS

## 2023-01-19 MED ORDER — POTASSIUM CHLORIDE CRYS ER 20 MEQ PO TBCR
40.0000 meq | EXTENDED_RELEASE_TABLET | Freq: Once | ORAL | Status: AC
  Administered 2023-01-19: 40 meq via ORAL
  Filled 2023-01-19: qty 2

## 2023-01-19 NOTE — Discharge Instructions (Addendum)
Take Prednisone as directed. You may take Ativan as needed for anxiety. Use Albuterol nebulizer as needed. Return to the ER if you change your mind regarding hospitalization, worsening symptoms, difficulty breathing or other concerns.

## 2023-01-20 NOTE — Progress Notes (Signed)
Primary Care Provider: [facility] Cardiologist: None Electrophysiologist: None  Clinic Note: Chief Complaint  Patient presents with   New Patient (Initial Visit)    Patient was at [facility] ER with hypokalemia and was having SVT. Patient c/o shortness of breath, pre-syncope, chest heaviness that radiates across to her left arm, pounding in head, palpitations and fluttering in chest at times. Medications reviewed by the patient verbally.     ===================================  ASSESSMENT/PLAN   Problem List Items Addressed This Visit       Cardiology Problems   Postural dizziness with near syncope (Chronic)    She has a history of orthostatic hypotension with near syncope, not recently.  Will check orthostatics and she does have a drop in pressure from 485-462 systolic. Recommend adequate hydration and conditioning.  Also would be leery of adding beta-blocker going on.  Plan: 2D echo and 14-day ZIO patch      Relevant Medications   amLODipine (NORVASC) 10 MG tablet   metoprolol tartrate (LOPRESSOR) 100 MG tablet   ivabradine (CORLANOR) 7.5 MG TABS tablet   Paroxysmal SVT (supraventricular tachycardia) (Chronic)    Does not feel like her send PSVT.  Tell if she is having more quick short lasting fluttering sensations.  However, would like to get a sense of what her rhythm is doing. Plan: Zio patch monitor.  Should be fine with 14 days      Relevant Medications   amLODipine (NORVASC) 10 MG tablet   metoprolol tartrate (LOPRESSOR) 100 MG tablet   ivabradine (CORLANOR) 7.5 MG TABS tablet     Other   TOBACCO USER    Counseling provided.      Palpitations    We determine what blood type palpitations having.  Need potential access to previous records.  Follow-up for now think we will start with the Zio patch monitor to assess.  Hold off on beta-blocker for now.      ELEVATED BLOOD PRESSURE WITHOUT DIAGNOSIS OF HYPERTENSION     BP is clearly in the borderline range.  She is currently on 10 mg amlodipine, having recently increased her dose. Will be leery of using beta-blocker given her COPD, if we did choose 1 we would use beta-1 selective drug such as bisoprolol.  We will use the results of her echocardiogram, Coronary CTA and monitor to determine which would be the next best appropriate medications.      Relevant Orders   EKG 12-Lead   ECHOCARDIOGRAM COMPLETE   Precordial pain - Primary (Chronic)    In addition to the sensation of fluttering and exertional dyspnea, she says that she has episodes of chest tightness described as a squeezing around her chest.  Oftentimes this is improved with her inhaler, but sometimes does not.  She does have a family history with some cardiovascular disease, and underlying hypertension.  Symptoms are somewhat concerning for angina would like to assess with more definitive evaluation then Myoview.  Plan: Coronary CTA      Relevant Orders   LONG TERM MONITOR (3-14 DAYS)   CT CORONARY MORPH W/CTA COR W/SCORE W/CA W/CM &/OR WO/CM   ===================================  HPI:    Amy Ferrell is a 57 y.o. female smoker with COPD, HTN and documented history of SVT who is being seen today for the evaluation of Chest Pain at the request of [facility] [place]*.  Actual referral was placed during ER visit.  Amy Ferrell actually presents as a ER  follow-up.  Recent Hospitalizations:  01/18/2022 -> [facility] ED: 3-day history of chest heaviness, anxiety and dyspnea.  Improved with nebulizer at home but then had every beat nebulizer shortly thereafter.  Dry cough and distress.  No fevers etc.  No recent travel.  Ruled out for PE.  EKG showed sinus tachycardia rate 114 bpm,~ LA enlargement, ~ Sep MI, age undetermined.  Troponin negative x 2 Was treated with Ativan, Solu-Medrol, nebulizer, Pepcid Had documented 6 beat run of Nonsustained VT on telemetry. Left AMA: Cited lack of  insurance, refused hospitalization.  Stressed about finances.  Mother died at [facility] and she was afraid to stay. => Discharged with as needed Ativan, prednisone burst and albuterol nebs. Urgent cardiology referral made.  Reviewed  CV studies:    The following studies were reviewed today: (if available, images/films reviewed: From Epic Chart or Care Everywhere) CTA Chest-PE 01/19/2023: No evidence of PE.  RML subpleural nodules up to 6 mm (stable) 7 mm groundglass nodule LUL.  Emphysematous changes noted left kidney upper pole scarring noted.  Interval History:   Amy Ferrell is a somewhat anxious 57 year old woman with a history of hypertension, anxiety, chronic pain with potential components of weight reactive airways/asthma.  She went to the emergency room just a few days ago with an episode of chest heaviness and anxiety and dyspnea with sensation of fast heart rates.  She did notice a sensation of fluttering in her chest that seem to last may be 2 to 3 minutes but more frequently over the course the weekend.  She describes having some chest tightness this usually better with standing or when she uses her inhaler.  With having some mild chest discomfort and dyspnea, the was concerned about there being a potential cardiac etiology.  However, the reassuring features that the symptoms are not always correlating with any particular activity and they can happen randomly with various different levels of activity.  CV Review of Symptoms (Summary): positive for - chest pain, dyspnea on exertion, irregular heartbeat, orthopnea, palpitations, paroxysmal nocturnal dyspnea, rapid heart rate, and shortness of breath negative for - syncope or near syncope, TIA/amaurosis fugax or claudication.  REVIEWED OF SYSTEMS   Review of Systems  Constitutional:  Positive for malaise/fatigue. Negative for weight loss.  HENT:  Positive for congestion.   Respiratory:  Positive for cough, shortness of breath and  wheezing.   Gastrointestinal:  Negative for blood in stool and melena.  Genitourinary:  Negative for dysuria and frequency.  Musculoskeletal:  Positive for joint pain.  Neurological:  Positive for dizziness.  Psychiatric/Behavioral:  Negative for depression and memory loss. The patient is nervous/anxious. The patient does not have insomnia.     I have reviewed and (if needed) personally updated the patient's problem list, medications, allergies, past medical and surgical history, social and family history.   PAST MEDICAL HISTORY   Past Medical History:  Diagnosis Date   Complication of anesthesia    2004 spinal did not work    COPD (chronic obstructive pulmonary disease) ([place])    Depression    Family history of adverse reaction to anesthesia    mom slow to wake up   GERD (gastroesophageal reflux disease)    Hypertension    Ovarian tumor    Renal disorder    conjoined kidney   SVT (supraventricular tachycardia)    UTI (urinary tract infection)     PAST SURGICAL HISTORY   Past Surgical History:  Procedure Laterality Date   ABDOMINAL HYSTERECTOMY  appebdectomy     APPENDECTOMY     CESAREAN SECTION     x3    CHOLECYSTECTOMY     LAPAROSCOPIC SALPINGO OOPHERECTOMY Right 10/16/2019   Procedure: LAPAROSCOPIC SALPINGO OOPHORECTOMY;  Surgeon: Harlin Heys, MD;  Location: [facility] ORS;  Service: Gynecology;  Laterality: Right;   VESICOVAGINAL FISTULA CLOSURE W/ TAH      Immunization History  Administered Date(s) Administered   H1N1 12/05/2008   Influenza, Seasonal, Injecte, Preservative Fre 09/19/2008, 11/13/2013, 11/20/2013   PFIZER(Purple Top)SARS-COV-2 Vaccination 04/08/2020, 05/04/2020   Tdap 11/26/2014    MEDICATIONS/ALLERGIES   Current Meds  Medication Sig   albuterol (PROVENTIL) (2.5 MG/3ML) 0.083% nebulizer solution Take 3 mLs (2.5 mg total) by nebulization every 6 (six) hours as needed for wheezing or shortness of breath.   albuterol (PROVENTIL) (2.5  MG/3ML) 0.083% nebulizer solution Take 3 mLs (2.5 mg total) by nebulization every 4 (four) hours as needed for wheezing or shortness of breath.   amLODipine (NORVASC) 10 MG tablet Take 10 mg by mouth daily.   FLOVENT HFA 220 MCG/ACT inhaler Inhale 2 puffs into the lungs 2 (two) times daily.   LORazepam (ATIVAN) 1 MG tablet Take 1 tablet (1 mg total) by mouth every 8 (eight) hours as needed for anxiety.   omeprazole (PRILOSEC) 40 MG capsule Take 40 mg by mouth 2 (two) times daily.     predniSONE (DELTASONE) 20 MG tablet 3 tablets daily x 4 days   traZODone (DESYREL) 100 MG tablet Take 50 mg by mouth at bedtime.    umeclidinium-vilanterol (ANORO ELLIPTA) 62.5-25 MCG/INH AEPB Inhale 1 puff into the lungs daily.    Allergies  Allergen Reactions   Adhesive [Tape] Rash    SOCIAL HISTORY/FAMILY HISTORY   Reviewed in Epic:   Social History   Tobacco Use   Smoking status: Former    Packs/day: 1.00    Years: 37.00    Total pack years: 37.00    Types: Cigarettes    Quit date: 11/16/2020    Years since quitting: 2.1   Smokeless tobacco: Never  Vaping Use   Vaping Use: Never used  Substance Use Topics   Alcohol use: Yes    Comment: occasional   Drug use: Yes    Types: Marijuana    Comment: occasional   Social History   Social History Narrative   Single.    Family History  Problem Relation Age of Onset   Hypertension Mother    Ovarian cancer Mother    Congestive Heart Failure Mother    Aortic aneurysm Maternal Grandmother    Heart attack Maternal Grandmother        Apparently had several.   Coronary artery disease Other        GF   COPD Maternal Aunt        Lymphedema   Breast cancer Neg Hx    MGF - multiple MI Mother - CHF,HTN M Aunt COPD/Emphysema.    OBJCTIVE -PE, EKG, labs   Wt Readings from Last 3 Encounters:  01/21/23 162 lb 2 oz (73.5 kg)  01/18/23 155 lb (70.3 kg)  12/16/20 149 lb 14.6 oz (68 kg)    Physical Exam: BP 138/80 (BP Location: Right Arm,  Patient Position: Sitting, Cuff Size: Normal)   Pulse 93   Ht '5\' 4"'$  (1.626 m)   Wt 162 lb 2 oz (73.5 kg)   BMI 27.83 kg/m  Physical Exam Vitals reviewed.  Constitutional:      General: She is not in  acute distress.    Appearance: Normal appearance. She is normal weight. She is not ill-appearing or toxic-appearing.  HENT:     Head: Normocephalic and atraumatic.  Neck:     Vascular: No carotid bruit.  Cardiovascular:     Rate and Rhythm: Normal rate and regular rhythm. Occasional Extrasystoles are present.    Chest Wall: PMI is not displaced.     Pulses: Intact distal pulses.     Heart sounds: S1 normal and S2 normal. No midsystolic click. No murmur heard.    No friction rub. No gallop.  Pulmonary:     Effort: Pulmonary effort is normal.     Breath sounds: Normal breath sounds. No wheezing.  Chest:     Chest wall: No tenderness.  Abdominal:     General: Abdomen is flat. Bowel sounds are normal.     Palpations: Abdomen is soft.  Musculoskeletal:        General: Normal range of motion.     Cervical back: Normal range of motion and neck supple.  Skin:    General: Skin is warm and dry.  Neurological:     General: No focal deficit present.     Mental Status: She is alert and oriented to person, place, and time.  Psychiatric:        Mood and Affect: Mood normal.        Behavior: Behavior normal.        Thought Content: Thought content normal.        Judgment: Judgment normal.     Comments: Not quite euthymic Limited.;  Less than favorable does not.      Adult ECG Report  Rate: 93 ;  Rhythm: normal sinus rhythm and left atrial enlargement, cannot exclude septal infarct/ischemia-. ;   Narrative Interpretation: Borderline  Recent Labs: Chemistry and CBC ordered today No results found for: "CHOL", "HDL", "LDLCALC", "LDLDIRECT", "TRIG", "CHOLHDL" Lab Results  Component Value Date   CREATININE 0.70 01/18/2023   BUN 12 01/18/2023   NA 139 01/18/2023   K 3.2 (L) 01/18/2023    CL 104 01/18/2023   CO2 24 01/18/2023      Latest Ref Rng & Units 01/18/2023   10:39 PM 10/09/2019   11:25 AM 08/04/2019    8:43 AM  CBC  WBC 4.0 - 10.5 K/uL 11.1  9.9  9.2   Hemoglobin 12.0 - 15.0 g/dL 15.5  14.0  14.6   Hematocrit 36.0 - 46.0 % 45.8  41.3  42.1   Platelets 150 - 400 K/uL 489  408  427     No results found for: "HGBA1C" Lab Results  Component Value Date   TSH 0.91 07/28/2012    ================================================== I spent a total of 33 minutes with the patient spent in direct patient consultation.  Additional time spent with chart review  / charting (studies, outside notes, etc): 12 min Total Time: 45 min  Current medicines are reviewed at length with the patient today.  (+/- concerns)   Notice: This dictation was prepared with Dragon dictation along with smart phrase technology. Any transcriptional errors that result from this process are unintentional and may not be corrected upon review.   Studies Ordered:  Orders Placed This Encounter  Procedures   CT CORONARY MORPH W/CTA COR W/SCORE W/CA W/CM &/OR WO/CM   LONG TERM MONITOR (3-14 DAYS)   EKG 12-Lead   ECHOCARDIOGRAM COMPLETE   Meds ordered this encounter  Medications   metoprolol tartrate (LOPRESSOR) 100 MG tablet  Sig: Take 1 tablet by mouth 2 hours prior to cardiac CT    Dispense:  1 tablet    Refill:  0   ivabradine (CORLANOR) 7.5 MG TABS tablet    Sig: Take 2 tablets (15 mg) by mouth 2 hours prior to cardiac CT    Dispense:  2 tablet    Refill:  0    Do NOT run through insurance will provide through [facility]    Patient Instructions / Medication Changes & Studies & Tests Ordered   Patient Instructions  Medication Instructions:  Your physician recommends that you continue on your current medications as directed. Please refer to the Current Medication list given to you today.  *If you need a refill on your cardiac medications before your next appointment, please call your  pharmacy*   Lab Work: -None ordered If you have labs (blood work) drawn today and your tests are completely normal, you will receive your results only by: [place] (if you have MyChart) OR A paper copy in the mail If you have any lab test that is abnormal or we need to change your treatment, we will call you to review the results.   Testing/Procedures: Your physician has requested that you have an echocardiogram. Echocardiography is a painless test that uses sound waves to create images of your heart. It provides your doctor with information about the size and shape of your heart and how well your heart's chambers and valves are working. This procedure takes approximately one hour. There are no restrictions for this procedure. Please do NOT wear cologne, perfume, aftershave, or lotions (deodorant is allowed). Please arrive 15 minutes prior to your appointment time.   2. Your physician has requested that you have cardiac CT. Cardiac computed tomography (CT) is a painless test that uses an x-ray machine to take clear, detailed pictures of your heart.       Your cardiac CT will be scheduled at one of the below locations:   [facility] [address] [place], [place] [address] (336) [facility] [place], [place] [address] [phone]   If scheduled at [facility] or [facility], please arrive 15 mins early for check-in and test prep.   Please follow these instructions carefully (unless otherwise directed):  We will administer nitroglycerin during this exam.   On the Night Before the Test: Be sure to Drink plenty of water. Do not consume any caffeinated/decaffeinated beverages or chocolate 12 hours prior to your test. Do not take any antihistamines 12 hours prior to your test.  On the Day of the Test: Drink plenty of water  until 1 hour prior to the test. Do not eat any food 1 hour prior to test. You may take your regular medications prior to the test.  Take metoprolol 100 mg (Lopressor) two hours prior to test. Take Corlanor 15 mg two hours prior to your test FEMALES- please wear underwire-free bra if available, avoid dresses & tight clothing       After the Test: Drink plenty of water. After receiving IV contrast, you may experience a mild flushed feeling. This is normal. On occasion, you may experience a mild rash up to 24 hours after the test. This is not dangerous. If this occurs, you can take Benadryl 25 mg and increase your fluid intake. If you experience trouble breathing, this can be serious. If it is severe call  911 IMMEDIATELY. If it is mild, please call our office.  We will call to schedule your test 2-4 weeks out understanding that some insurance companies will need an authorization prior to the service being performed.   For non-scheduling related questions, please contact the cardiac imaging nurse navigator should you have any questions/concerns: Marchia Bond, Cardiac Imaging Nurse Navigator Gordy Clement, Cardiac Imaging Nurse Navigator [facility] Direct Office Dial: [phone]   For scheduling needs, including cancellations and rescheduling, please call [place], [phone].   3. Your physician has recommended that you wear a Zio monitor.   This monitor is a medical device that records the heart's electrical activity. Doctors most often use these monitors to diagnose arrhythmias. Arrhythmias are problems with the speed or rhythm of the heartbeat. The monitor is a small device applied to your chest. You can wear one while you do your normal daily activities. While wearing this monitor if you have any symptoms to push the button and record what you felt. Once you have worn this monitor for the period of time provider prescribed (Usually 14 days), you will return  the monitor device in the postage paid box. Once it is returned they will download the data collected and provide [place] with a report which the provider will then review and we will call you with those results. Important tips:  Avoid showering during the first 24 hours of wearing the monitor. Avoid excessive sweating to help maximize wear time. Do not submerge the device, no hot tubs, and no swimming pools. Keep any lotions or oils away from the patch. After 24 hours you may shower with the patch on. Take brief showers with your back facing the shower head.  Do not remove patch once it has been placed because that will interrupt data and decrease adhesive wear time. Push the button when you have any symptoms and write down what you were feeling. Once you have completed wearing your monitor, remove and place into box which has postage paid and place in your outgoing mailbox.  If for some reason you have misplaced your box then call our office and we can provide another box and/or mail it off for you.     Follow-Up: At [facility], you and your health needs are our priority.  As part of our continuing mission to provide you with exceptional heart care, we have created designated Provider Care Teams.  These Care Teams include your primary Cardiologist (physician) and Advanced Practice Providers (APPs -  Physician Assistants and Nurse Practitioners) who all work together to provide you with the care you need, when you need it.  We recommend signing up for the patient portal called "MyChart".  Sign up information is provided on this After Visit Summary.  MyChart is used to connect with patients for Virtual Visits (Telemedicine).  Patients are able to view lab/test results, encounter notes, upcoming appointments, etc.  Non-urgent messages can be sent to your provider as well.   To learn more about what you can do with MyChart, go to [email or web address].    Your next appointment:   6 - 8  week(s)  Provider:   You may see Leonie Man, MD or one of the following Advanced Practice Providers on your designated Care Team:   Murray Hodgkins, NP Christell Faith, PA-C Cadence Kathlen Mody, PA-C Gerrie Nordmann, NP  Other Instructions -None      Leonie Man, MD, MS Glenetta Hew, M.D., M.S. Interventional Cardiologist  [facility]  [place] [facility]   [address]; [place], [place]  [address] [phone]           Fax [phone]    Thank you for choosing [facility] in [place]!!

## 2023-01-21 ENCOUNTER — Ambulatory Visit (INDEPENDENT_AMBULATORY_CARE_PROVIDER_SITE_OTHER): Payer: Self-pay

## 2023-01-21 ENCOUNTER — Ambulatory Visit: Payer: Self-pay | Attending: Cardiology | Admitting: Cardiology

## 2023-01-21 ENCOUNTER — Encounter: Payer: Self-pay | Admitting: Cardiology

## 2023-01-21 VITALS — BP 138/80 | HR 93 | Ht 64.0 in | Wt 162.1 lb

## 2023-01-21 DIAGNOSIS — F17200 Nicotine dependence, unspecified, uncomplicated: Secondary | ICD-10-CM

## 2023-01-21 DIAGNOSIS — R079 Chest pain, unspecified: Secondary | ICD-10-CM

## 2023-01-21 DIAGNOSIS — R072 Precordial pain: Secondary | ICD-10-CM

## 2023-01-21 DIAGNOSIS — R030 Elevated blood-pressure reading, without diagnosis of hypertension: Secondary | ICD-10-CM

## 2023-01-21 DIAGNOSIS — R002 Palpitations: Secondary | ICD-10-CM | POA: Insufficient documentation

## 2023-01-21 DIAGNOSIS — R55 Syncope and collapse: Secondary | ICD-10-CM | POA: Insufficient documentation

## 2023-01-21 DIAGNOSIS — R42 Dizziness and giddiness: Secondary | ICD-10-CM | POA: Insufficient documentation

## 2023-01-21 DIAGNOSIS — I471 Supraventricular tachycardia, unspecified: Secondary | ICD-10-CM

## 2023-01-21 DIAGNOSIS — I739 Peripheral vascular disease, unspecified: Secondary | ICD-10-CM

## 2023-01-21 MED ORDER — METOPROLOL TARTRATE 100 MG PO TABS: ORAL_TABLET | ORAL | 0 refills | Status: DC

## 2023-01-21 MED ORDER — IVABRADINE HCL 7.5 MG PO TABS: ORAL_TABLET | ORAL | 0 refills | Status: DC

## 2023-01-21 NOTE — Patient Instructions (Signed)
Medication Instructions:  Your physician recommends that you continue on your current medications as directed. Please refer to the Current Medication list given to you today.  *If you need a refill on your cardiac medications before your next appointment, please call your pharmacy*   Lab Work: -None ordered If you have labs (blood work) drawn today and your tests are completely normal, you will receive your results only by: [place] (if you have MyChart) OR A paper copy in the mail If you have any lab test that is abnormal or we need to change your treatment, we will call you to review the results.   Testing/Procedures: Your physician has requested that you have an echocardiogram. Echocardiography is a painless test that uses sound waves to create images of your heart. It provides your doctor with information about the size and shape of your heart and how well your heart's chambers and valves are working. This procedure takes approximately one hour. There are no restrictions for this procedure. Please do NOT wear cologne, perfume, aftershave, or lotions (deodorant is allowed). Please arrive 15 minutes prior to your appointment time.   2. Your physician has requested that you have cardiac CT. Cardiac computed tomography (CT) is a painless test that uses an x-ray machine to take clear, detailed pictures of your heart.       Your cardiac CT will be scheduled at one of the below locations:   [facility] [address] [place], [place] [address] (336) [facility] [place], [place] [address] [phone]   If scheduled at [facility] or [facility], please arrive 15 mins early for check-in and test prep.   Please follow these instructions carefully (unless otherwise directed):  We will administer nitroglycerin during this exam.    On the Night Before the Test: Be sure to Drink plenty of water. Do not consume any caffeinated/decaffeinated beverages or chocolate 12 hours prior to your test. Do not take any antihistamines 12 hours prior to your test.  On the Day of the Test: Drink plenty of water until 1 hour prior to the test. Do not eat any food 1 hour prior to test. You may take your regular medications prior to the test.  Take metoprolol 100 mg (Lopressor) two hours prior to test. Take Corlanor 15 mg two hours prior to your test FEMALES- please wear underwire-free bra if available, avoid dresses & tight clothing       After the Test: Drink plenty of water. After receiving IV contrast, you may experience a mild flushed feeling. This is normal. On occasion, you may experience a mild rash up to 24 hours after the test. This is not dangerous. If this occurs, you can take Benadryl 25 mg and increase your fluid intake. If you experience trouble breathing, this can be serious. If it is severe call 911 IMMEDIATELY. If it is mild, please call our office.  We will call to schedule your test 2-4 weeks out understanding that some insurance companies will need an authorization prior to the service being performed.   For non-scheduling related questions, please contact the cardiac imaging nurse navigator should you have any questions/concerns: Marchia Bond, Cardiac Imaging Nurse Navigator Gordy Clement, Cardiac Imaging Nurse Navigator [facility] Direct Office Dial: [phone]   For scheduling needs, including cancellations and rescheduling, please call [place], [phone].   3. Your physician  has recommended that you wear a Zio monitor.   This monitor is a medical device that records the heart's electrical activity. Doctors most often use these monitors to diagnose arrhythmias. Arrhythmias are problems with the speed or rhythm of the heartbeat. The monitor is a small device applied to  your chest. You can wear one while you do your normal daily activities. While wearing this monitor if you have any symptoms to push the button and record what you felt. Once you have worn this monitor for the period of time provider prescribed (Usually 14 days), you will return the monitor device in the postage paid box. Once it is returned they will download the data collected and provide [place] with a report which the provider will then review and we will call you with those results. Important tips:  Avoid showering during the first 24 hours of wearing the monitor. Avoid excessive sweating to help maximize wear time. Do not submerge the device, no hot tubs, and no swimming pools. Keep any lotions or oils away from the patch. After 24 hours you may shower with the patch on. Take brief showers with your back facing the shower head.  Do not remove patch once it has been placed because that will interrupt data and decrease adhesive wear time. Push the button when you have any symptoms and write down what you were feeling. Once you have completed wearing your monitor, remove and place into box which has postage paid and place in your outgoing mailbox.  If for some reason you have misplaced your box then call our office and we can provide another box and/or mail it off for you.     Follow-Up: At [facility], you and your health needs are our priority.  As part of our continuing mission to provide you with exceptional heart care, we have created designated Provider Care Teams.  These Care Teams include your primary Cardiologist (physician) and Advanced Practice Providers (APPs -  Physician Assistants and Nurse Practitioners) who all work together to provide you with the care you need, when you need it.  We recommend signing up for the patient portal called "MyChart".  Sign up information is provided on this After Visit Summary.  MyChart is used to connect with patients for Virtual Visits  (Telemedicine).  Patients are able to view lab/test results, encounter notes, upcoming appointments, etc.  Non-urgent messages can be sent to your provider as well.   To learn more about what you can do with MyChart, go to [email or web address].    Your next appointment:   6 - 8 week(s)  Provider:   You may see Leonie Man, MD or one of the following Advanced Practice Providers on your designated Care Team:   Murray Hodgkins, NP Christell Faith, PA-C Cadence Kathlen Mody, PA-C Gerrie Nordmann, NP  Other Instructions -None

## 2023-01-22 ENCOUNTER — Encounter: Payer: Self-pay | Admitting: Cardiology

## 2023-01-22 ENCOUNTER — Ambulatory Visit: Payer: Self-pay | Admitting: Cardiology

## 2023-01-22 NOTE — Assessment & Plan Note (Signed)
We determine what blood type palpitations having.  Need potential access to previous records.  Follow-up for now think we will start with the Zio patch monitor to assess.  Hold off on beta-blocker for now.

## 2023-01-22 NOTE — Assessment & Plan Note (Signed)
Counseling provided

## 2023-01-22 NOTE — Assessment & Plan Note (Signed)
In addition to the sensation of fluttering and exertional dyspnea, she says that she has episodes of chest tightness described as a squeezing around her chest.  Oftentimes this is improved with her inhaler, but sometimes does not.  She does have a family history with some cardiovascular disease, and underlying hypertension.  Symptoms are somewhat concerning for angina would like to assess with more definitive evaluation then Myoview.  Plan: Coronary CTA

## 2023-01-22 NOTE — Assessment & Plan Note (Signed)
She has a history of orthostatic hypotension with near syncope, not recently.  Will check orthostatics and she does have a drop in pressure from 195-974 systolic. Recommend adequate hydration and conditioning.  Also would be leery of adding beta-blocker going on.  Plan: 2D echo and 14-day ZIO patch

## 2023-01-22 NOTE — Assessment & Plan Note (Signed)
BP is clearly in the borderline range.  She is currently on 10 mg amlodipine, having recently increased her dose. Will be leery of using beta-blocker given her COPD, if we did choose 1 we would use beta-1 selective drug such as bisoprolol.  We will use the results of her echocardiogram, Coronary CTA and monitor to determine which would be the next best appropriate medications.

## 2023-01-22 NOTE — Assessment & Plan Note (Signed)
Does not feel like her send PSVT.  Tell if she is having more quick short lasting fluttering sensations.  However, would like to get a sense of what her rhythm is doing. Plan: Zio patch monitor.  Should be fine with 14 days

## 2023-01-25 DIAGNOSIS — R072 Precordial pain: Secondary | ICD-10-CM

## 2023-02-10 ENCOUNTER — Telehealth (HOSPITAL_COMMUNITY): Payer: Self-pay | Admitting: Emergency Medicine

## 2023-02-10 NOTE — Telephone Encounter (Signed)
Reaching out to patient to offer assistance regarding upcoming cardiac imaging study; pt verbalizes understanding of appt date/time, parking situation and where to check in, pre-test NPO status and medications ordered, and verified current allergies; name and call back number provided for further questions should they arise Amy Bond RN Navigator Cardiac Imaging [facility] [phone] office [phone] cell   Arrival 300 [facility] 112m metoprolol + 143mivabradine Denies iv issues Aware contrast/nitro

## 2023-02-11 ENCOUNTER — Ambulatory Visit
Admission: RE | Admit: 2023-02-11 | Discharge: 2023-02-11 | Disposition: A | Discharge status: Home / Self Care | Payer: Self-pay | Source: Ambulatory Visit | Attending: Cardiology | Admitting: Cardiology

## 2023-02-11 DIAGNOSIS — R072 Precordial pain: Secondary | ICD-10-CM | POA: Insufficient documentation

## 2023-02-11 MED ORDER — NITROGLYCERIN 0.4 MG SL SUBL
0.8000 mg | SUBLINGUAL_TABLET | Freq: Once | SUBLINGUAL | Status: AC
  Administered 2023-02-11: 0.8 mg via SUBLINGUAL

## 2023-02-11 MED ORDER — METOPROLOL TARTRATE 5 MG/5ML IV SOLN
5.0000 mg | Freq: Once | INTRAVENOUS | Status: AC
  Administered 2023-02-11: 5 mg via INTRAVENOUS

## 2023-02-11 MED ORDER — METOPROLOL TARTRATE 5 MG/5ML IV SOLN
10.0000 mg | Freq: Once | INTRAVENOUS | Status: AC
  Administered 2023-02-11: 10 mg via INTRAVENOUS

## 2023-02-11 MED ORDER — IOHEXOL 350 MG/ML SOLN
75.0000 mL | Freq: Once | INTRAVENOUS | Status: AC | PRN
  Administered 2023-02-11: 75 mL via INTRAVENOUS

## 2023-02-11 NOTE — Progress Notes (Signed)
Patient tolerated CT well. Drank water after. Vital signs stable encourage to drink water throughout day.Reasons explained and verbalized understanding. Ambulated steady gait.  

## 2023-02-14 NOTE — Progress Notes (Signed)
Coronary CTA results:  Current calcium score 18.6.  Low risk.  Minimal proximal LAD disease(<25%).  Considered nonatherosclerotic coronary artery disease as cause for chest pain.  Recommend preventive therapy and risk factor modification..  -This test is a relatively definitive evaluation suggesting that chest pain or shortness of breath is not related to heart artery disease.  Glenetta Hew, MD

## 2023-02-23 ENCOUNTER — Ambulatory Visit: Payer: Self-pay | Admitting: Cardiology

## 2023-02-26 ENCOUNTER — Ambulatory Visit: Payer: BC Managed Care – PPO | Attending: Cardiology

## 2023-02-26 DIAGNOSIS — R030 Elevated blood-pressure reading, without diagnosis of hypertension: Secondary | ICD-10-CM | POA: Diagnosis not present

## 2023-03-02 LAB — ECHOCARDIOGRAM COMPLETE
Area-P 1/2: 10.54 cm2
P 1/2 time: 401 msec
S' Lateral: 2.8 cm

## 2023-03-11 ENCOUNTER — Ambulatory Visit: Payer: BC Managed Care – PPO | Admitting: Cardiology

## 2023-03-30 ENCOUNTER — Telehealth: Payer: Self-pay | Admitting: Cardiology

## 2023-03-30 NOTE — Telephone Encounter (Signed)
Spoke with pt regarding her monitor results. Results given. Pt wants to wait until her May 2 appointment to see how she feels, at that time she can discuss medication with Dr. Herbie Baltimore.

## 2023-03-30 NOTE — Telephone Encounter (Signed)
Patient stated she is returning RN's call regarding monitor results.

## 2023-04-22 ENCOUNTER — Ambulatory Visit: Payer: BC Managed Care – PPO | Admitting: Cardiology

## 2023-05-06 ENCOUNTER — Ambulatory Visit: Payer: BC Managed Care – PPO | Attending: Cardiology | Admitting: Nurse Practitioner

## 2023-05-06 ENCOUNTER — Encounter: Payer: Self-pay | Admitting: Nurse Practitioner

## 2023-05-06 NOTE — Progress Notes (Deleted)
Office Visit    Patient Name: Amy Ferrell Date of Encounter: 05/06/2023  Primary Care Provider:  [facility] Primary Cardiologist:  Bryan Lemma, MD  Chief Complaint    57 year old female with a history of PSVT, hypertension, tobacco abuse, chest pain with nonobstructive CAD, moderate AI, and GERD, who presents for follow-up related to***  Past Medical History    Past Medical History:  Diagnosis Date   Complication of anesthesia    2004 spinal did not work    COPD (chronic obstructive pulmonary disease) (HCC)    Depression    Essential hypertension    Family history of adverse reaction to anesthesia    mom slow to wake up   GERD (gastroesophageal reflux disease)    Hypertension    Moderate aortic insufficiency    a. 02/2023 Echo: EF 60-65%, no rwma, nl RV fxn, RVSP . Mild MR. Mod AI.   Nonosbstructive CAD (coronary artery disease)    a. 01/2023 Cor CTA: Ca2+ = 18.6 (85th%'ile). LM nl, LAD <25p, LCX nl, RCA nl.   Ovarian tumor    PSVT (paroxysmal supraventricular tachycardia)    a. 01/2023 Zio: Predominantly sinus rhythm with average of 56 (56-123).  Rare PACs and PVCs.  7 brief atrial runs (fastest 19 beats up to 174).  Symptoms noted with 5 SVT runs and PACs.   Renal disorder    conjoined kidney   UTI (urinary tract infection)    Past Surgical History:  Procedure Laterality Date   ABDOMINAL HYSTERECTOMY     appebdectomy     APPENDECTOMY     CESAREAN SECTION     x3    CHOLECYSTECTOMY     LAPAROSCOPIC SALPINGO OOPHERECTOMY Right 10/16/2019   Procedure: LAPAROSCOPIC SALPINGO OOPHORECTOMY;  Surgeon: Linzie Collin, MD;  Location: [facility] ORS;  Service: Gynecology;  Laterality: Right;   VESICOVAGINAL FISTULA CLOSURE W/ TAH      Allergies  Allergies  Allergen Reactions   Adhesive [Tape] Rash    History of Present Illness    57 year old female with above past medical history including PSVT, hypertension, tobacco abuse, chest  pain, nonobstructive CAD, moderate AI, and GERD.  She establish care in our practice in February of this year after ED presentation with chest heaviness, anxiety, and dyspnea.  She reported palpitations as well at follow-up visit.  Coronary CT angiogram was performed in February which showed a calcium score of 18.6 (85th percentile), with mild, nonobstructive LAD disease.  ZIO monitoring showed predominantly sinus bradycardia with 7 brief atrial runs up to 174 bpm.  Symptoms were noted with SVT and PACs.  Echo in March 2024 showed an EF of 60 to 65% with RVSP of 33 mmHg, mild MR, and moderate AI.  ***  Home Medications    Current Outpatient Medications  Medication Sig Dispense Refill   albuterol (PROVENTIL) (2.5 MG/3ML) 0.083% nebulizer solution Take 3 mLs (2.5 mg total) by nebulization every 6 (six) hours as needed for wheezing or shortness of breath. 75 mL 0   albuterol (PROVENTIL) (2.5 MG/3ML) 0.083% nebulizer solution Take 3 mLs (2.5 mg total) by nebulization every 4 (four) hours as needed for wheezing or shortness of breath. 75 mL 0   amLODipine (NORVASC) 10 MG tablet Take 10 mg by mouth daily.     azithromycin (ZITHROMAX Z-PAK) 250 MG tablet Take 2 tablets (500 mg) on  Day 1,  followed by 1 tablet (250 mg) once daily on Days 2 through 5. (Patient not  taking: Reported on 01/21/2023) 6 each 0   FLOVENT HFA 220 MCG/ACT inhaler Inhale 2 puffs into the lungs 2 (two) times daily.     HYDROcodone-homatropine (HYCODAN) 5-1.5 MG/5ML syrup Take 5 mLs by mouth every 6 (six) hours as needed for cough. (Patient not taking: Reported on 01/21/2023) 120 mL 0   ivabradine (CORLANOR) 7.5 MG TABS tablet Take 2 tablets (15 mg) by mouth 2 hours prior to cardiac CT 2 tablet 0   LORazepam (ATIVAN) 1 MG tablet Take 1 tablet (1 mg total) by mouth every 8 (eight) hours as needed for anxiety. 9 tablet 0   metoprolol tartrate (LOPRESSOR) 100 MG tablet Take 1 tablet by mouth 2 hours prior to cardiac CT 1 tablet 0    nitrofurantoin, macrocrystal-monohydrate, (MACROBID) 100 MG capsule Take 1 capsule (100 mg total) by mouth 2 (two) times daily. (Patient not taking: Reported on 01/21/2023) 14 capsule 0   omeprazole (PRILOSEC) 40 MG capsule Take 40 mg by mouth 2 (two) times daily.       oxyCODONE-acetaminophen (PERCOCET/ROXICET) 5-325 MG tablet Take 1-2 tablets by mouth every 6 (six) hours as needed. (Patient not taking: Reported on 10/24/2019) 20 tablet 0   predniSONE (DELTASONE) 20 MG tablet 3 tablets daily x 4 days 12 tablet 0   traZODone (DESYREL) 100 MG tablet Take 50 mg by mouth at bedtime.      umeclidinium-vilanterol (ANORO ELLIPTA) 62.5-25 MCG/INH AEPB Inhale 1 puff into the lungs daily.     No current facility-administered medications for this visit.     Review of Systems    ***.  All other systems reviewed and are otherwise negative except as noted above.    Physical Exam    VS:  There were no vitals taken for this visit. , BMI There is no height or weight on file to calculate BMI.     GEN: Well nourished, well developed, in no acute distress. HEENT: normal. Neck: Supple, no JVD, carotid bruits, or masses. Cardiac: RRR, no murmurs, rubs, or gallops. No clubbing, cyanosis, edema.  Radials 2+/PT 2+ and equal bilaterally.  Respiratory:  Respirations regular and unlabored, clear to auscultation bilaterally. GI: Soft, nontender, nondistended, BS + x 4. MS: no deformity or atrophy. Skin: warm and dry, no rash. Neuro:  Strength and sensation are intact. Psych: Normal affect.  Accessory Clinical Findings    ECG personally reviewed by me today - *** - no acute changes.  Lab Results  Component Value Date   WBC 11.1 (H) 01/18/2023   HGB 15.5 (H) 01/18/2023   HCT 45.8 01/18/2023   MCV 98.1 01/18/2023   PLT 489 (H) 01/18/2023   Lab Results  Component Value Date   CREATININE 0.70 01/18/2023   BUN 12 01/18/2023   NA 139 01/18/2023   K 3.2 (L) 01/18/2023   CL 104 01/18/2023   CO2 24  01/18/2023   Lab Results  Component Value Date   ALT 15 01/18/2023   AST 27 01/18/2023   ALKPHOS 129 (H) 01/18/2023   BILITOT 0.5 01/18/2023   No results found for: "CHOL", "HDL", "LDLCALC", "LDLDIRECT", "TRIG", "CHOLHDL"  No results found for: "HGBA1C"  Assessment & Plan    1.  ***   Nicolasa Ducking, NP 05/06/2023, 1:31 PM

## 2023-05-07 ENCOUNTER — Encounter: Payer: Self-pay | Admitting: Nurse Practitioner

## 2023-06-29 ENCOUNTER — Encounter: Payer: Self-pay | Admitting: Internal Medicine

## 2023-06-29 ENCOUNTER — Ambulatory Visit: Payer: BC Managed Care – PPO | Admitting: Internal Medicine

## 2023-06-29 VITALS — BP 118/90 | HR 75 | Temp 97.7°F | Resp 16 | Ht 64.0 in | Wt 163.2 lb

## 2023-06-29 DIAGNOSIS — J4489 Other specified chronic obstructive pulmonary disease: Secondary | ICD-10-CM

## 2023-06-29 DIAGNOSIS — Z7722 Contact with and (suspected) exposure to environmental tobacco smoke (acute) (chronic): Secondary | ICD-10-CM | POA: Diagnosis not present

## 2023-06-29 DIAGNOSIS — R0602 Shortness of breath: Secondary | ICD-10-CM | POA: Diagnosis not present

## 2023-06-29 MED ORDER — BREZTRI AEROSPHERE 160-9-4.8 MCG/ACT IN AERO: 2.0000 | INHALATION_SPRAY | Freq: Two times a day (BID) | RESPIRATORY_TRACT | 11 refills | Status: DC

## 2023-06-29 NOTE — Progress Notes (Signed)
[facility] [address] [place], [place] [address]  Pulmonary Sleep Medicine   Office Visit Note  Patient Name: Amy Ferrell DOB: 08-14-1966 MRN [id number]  Date of Service: 06/29/2023  Complaints/HPI: She has had a diagnosis of COPD and also nodules. She states she has not had a consultation for this before. She states she has not been getting her medications due to deductible. Patient states she has financial issues. She currently has [employer]. She states she is not sure if the trelegy is helping. She gets shortness of breath when she exerts herself. She states she has cough. She feels fatigued. She also has noted having nasal drip and she has throat soreness. She does smoke and she states she is doing about 2PPD. She wants to smoke but her she works from home and she works remotely so she tends to smoke more. She also is not active.   Office Spirometry Results:     ROS  General: (-) fever, (-) chills, (-) night sweats, (-) weakness Skin: (-) rashes, (-) itching,. Eyes: (-) visual changes, (-) redness, (-) itching. Nose and Sinuses: (-) nasal stuffiness or itchiness, (-) postnasal drip, (-) nosebleeds, (-) sinus trouble. Mouth and Throat: (-) sore throat, (-) hoarseness. Neck: (-) swollen glands, (-) enlarged thyroid, (-) neck pain. Respiratory: + cough, (-) bloody sputum, + shortness of breath, + wheezing. Cardiovascular: - ankle swelling, (-) chest pain. Lymphatic: (-) lymph node enlargement. Neurologic: (-) numbness, (-) tingling. Psychiatric: (-) anxiety, (-) depression   Current Medication: Outpatient Encounter Medications as of 06/29/2023  Medication Sig   albuterol (PROVENTIL) (2.5 MG/3ML) 0.083% nebulizer solution Take 3 mLs (2.5 mg total) by nebulization every 6 (six) hours as needed for wheezing or shortness of breath.   albuterol (PROVENTIL) (2.5 MG/3ML) 0.083% nebulizer solution Take 3 mLs (2.5 mg total) by nebulization every 4 (four) hours as needed  for wheezing or shortness of breath.   amLODipine (NORVASC) 10 MG tablet Take 10 mg by mouth daily.   FLOVENT HFA 220 MCG/ACT inhaler Inhale 2 puffs into the lungs 2 (two) times daily.   HYDROcodone-homatropine (HYCODAN) 5-1.5 MG/5ML syrup Take 5 mLs by mouth every 6 (six) hours as needed for cough.   LORazepam (ATIVAN) 1 MG tablet Take 1 tablet (1 mg total) by mouth every 8 (eight) hours as needed for anxiety.   nitrofurantoin, macrocrystal-monohydrate, (MACROBID) 100 MG capsule Take 1 capsule (100 mg total) by mouth 2 (two) times daily.   omeprazole (PRILOSEC) 40 MG capsule Take 40 mg by mouth 2 (two) times daily.     umeclidinium-vilanterol (ANORO ELLIPTA) 62.5-25 MCG/INH AEPB Inhale 1 puff into the lungs daily.   [DISCONTINUED] azithromycin (ZITHROMAX Z-PAK) 250 MG tablet Take 2 tablets (500 mg) on  Day 1,  followed by 1 tablet (250 mg) once daily on Days 2 through 5.   [DISCONTINUED] oxyCODONE-acetaminophen (PERCOCET/ROXICET) 5-325 MG tablet Take 1-2 tablets by mouth every 6 (six) hours as needed.   [DISCONTINUED] predniSONE (DELTASONE) 20 MG tablet 3 tablets daily x 4 days   [DISCONTINUED] traZODone (DESYREL) 100 MG tablet Take 50 mg by mouth at bedtime.    ivabradine (CORLANOR) 7.5 MG TABS tablet Take 2 tablets (15 mg) by mouth 2 hours prior to cardiac CT   metoprolol tartrate (LOPRESSOR) 100 MG tablet Take 1 tablet by mouth 2 hours prior to cardiac CT   No facility-administered encounter medications on file as of 06/29/2023.    Surgical History: Past Surgical History:  Procedure Laterality Date  ABDOMINAL HYSTERECTOMY     appebdectomy     APPENDECTOMY     CESAREAN SECTION     x3    CHOLECYSTECTOMY     LAPAROSCOPIC SALPINGO OOPHERECTOMY Right 10/16/2019   Procedure: LAPAROSCOPIC SALPINGO OOPHORECTOMY;  Surgeon: Linzie Collin, MD;  Location: [facility] ORS;  Service: Gynecology;  Laterality: Right;   VESICOVAGINAL FISTULA CLOSURE W/ TAH      Medical History: Past Medical History:   Diagnosis Date   Complication of anesthesia    2004 spinal did not work    COPD (chronic obstructive pulmonary disease) (HCC)    Depression    Essential hypertension    Family history of adverse reaction to anesthesia    mom slow to wake up   GERD (gastroesophageal reflux disease)    Hypertension    Moderate aortic insufficiency    a. 02/2023 Echo: EF 60-65%, no rwma, nl RV fxn, RVSP . Mild MR. Mod AI.   Nonosbstructive CAD (coronary artery disease)    a. 01/2023 Cor CTA: Ca2+ = 18.6 (85th%'ile). LM nl, LAD <25p, LCX nl, RCA nl.   Ovarian tumor    PSVT (paroxysmal supraventricular tachycardia)    a. 01/2023 Zio: Predominantly sinus rhythm with average of 56 (56-123).  Rare PACs and PVCs.  7 brief atrial runs (fastest 19 beats up to 174).  Symptoms noted with 5 SVT runs and PACs.   Renal disorder    conjoined kidney   UTI (urinary tract infection)     Family History: Family History  Problem Relation Age of Onset   Hypertension Mother    Ovarian cancer Mother    Congestive Heart Failure Mother    Aortic aneurysm Maternal Grandmother    Heart attack Maternal Grandmother        Apparently had several.   Coronary artery disease Other        GF   COPD Maternal Aunt        Lymphedema   Breast cancer Neg Hx     Social History: Social History   Socioeconomic History   Marital status: Married    Spouse name: Not on file   Number of children: Not on file   Years of education: Not on file   Highest education level: Not on file  Occupational History   Not on file  Tobacco Use   Smoking status: Every Day    Packs/day: 1.00    Years: 37.00    Additional pack years: 0.00    Total pack years: 37.00    Types: Cigarettes    Last attempt to quit: 11/16/2020    Years since quitting: 2.6   Smokeless tobacco: Never   Tobacco comments:    Smokes over a pack a day.  Vaping Use   Vaping Use: Never used  Substance and Sexual Activity   Alcohol use: Yes    Comment:  occasional   Drug use: Yes    Types: Marijuana    Comment: occasional   Sexual activity: Yes    Birth control/protection: Surgical  Other Topics Concern   Not on file  Social History Narrative   Single.    Social Determinants of Health   Financial Resource Strain: Not on file  Food Insecurity: Not on file  Transportation Needs: Not on file  Physical Activity: Not on file  Stress: Not on file  Social Connections: Not on file  Intimate Partner Violence: Not on file    Vital Signs: Blood pressure (!) 118/90, pulse 75, temperature  97.7 F (36.5 C), resp. rate 16, height 5\' 4"  (1.626 m), weight 163 lb 3.2 oz (74 kg), SpO2 98 %.  Examination: General Appearance: The patient is well-developed, well-nourished, and in no distress. Skin: Gross inspection of skin unremarkable. Head: normocephalic, no gross deformities. Eyes: no gross deformities noted. ENT: ears appear grossly normal no exudates. Neck: Supple. No thyromegaly. No LAD. Respiratory: no rhonchi noted. Cardiovascular: Normal S1 and S2 without murmur or rub. Extremities: No cyanosis. pulses are equal. Neurologic: Alert and oriented. No involuntary movements.  LABS: No results found for this or any previous visit (from the past 2160 hour(s)).  Radiology: CT CORONARY MORPH W/CTA COR W/SCORE W/CA W/CM &/OR WO/CM  Addendum Date: 02/14/2023   ADDENDUM REPORT: 02/14/2023 12:00 ADDENDUM: OVER-READ INTERPRETATION  CT CHEST The following report is an over-read performed by radiologist Dr. Thora Lance III MD of [facility] on Creation date. This over-read does not include interpretation of cardiac or coronary anatomy or pathology. The CTA interpretation by the cardiologist is attached. No pericardial effusion. No evident mediastinal mass or adenopathy. No pleural effusion. No pneumothorax. 8 mm pleural-based nodule, right middle lobe (Im35,Se12) stable since 04/07/2006 consistent with benign process. No new nodule  or infiltrate evident. Cholecystectomy clips. Left nephrolithiasis. Regional bones unremarkable. IMPRESSION: No acute findings Electronically Signed   By: Corlis Leak M.D.   On: 02/14/2023 12:00   Result Date: 02/14/2023 CLINICAL DATA:  Chest pain, shortness of breath EXAM: Cardiac/Coronary  CTA TECHNIQUE: The patient was scanned on a Siemens Somatom go.Top scanner. : A retrospective scan was triggered in the ascending thoracic aorta. Axial non-contrast 3 mm slices were carried out through the heart. The data set was analyzed on a dedicated work station and scored using the Agatson method. Gantry rotation speed was 330 msecs and collimation was .6 mm. 100mg  of metoprolol and 0.8 mg of sl NTG was given. The 3D data set was reconstructed in 5% intervals of the 60-95 % of the R-R cycle. Diastolic phases were analyzed on a dedicated work station using MPR, MIP and VRT modes. The patient received 75 cc of contrast. FINDINGS: Aorta:  Normal size.  No calcifications.  No dissection. Aortic Valve:  Trileaflet.  No calcifications. Coronary Arteries:  Normal coronary origin.  Right dominance. RCA is a large dominant artery that gives rise to PDA and PLA. There is no plaque. Left main gives rise to LAD and LCX arteries.  LM has no disease. LAD has calcified plaque proximally causing minimal stenosis (<25%). LCX is a non-dominant artery. There is no plaque. Other findings: Normal pulmonary vein drainage into the left atrium. Normal left atrial appendage without a thrombus. Normal size of the pulmonary artery. IMPRESSION: 1. Coronary calcium score of 18.6. This was 85th percentile for age and sex matched control. 2. Normal coronary origin with right dominance. 3. Minimal proximal LAD stenosis (<25%). 4. CAD-RADS 1. Minimal non-obstructive CAD (0-24%). Consider non-atherosclerotic causes of chest pain. Consider preventive therapy and risk factor modification. Electronically Signed: By: Debbe Odea M.D. On: 02/11/2023  16:17    No results found.  No results found.  Assessment and Plan: Patient Active Problem List   Diagnosis Date Noted   Palpitations 01/21/2023   Postural dizziness with near syncope 01/21/2023   TOBACCO USER 06/27/2010   CLAUDICATION 06/27/2010   ELEVATED BLOOD PRESSURE WITHOUT DIAGNOSIS OF HYPERTENSION 06/27/2010   Paroxysmal SVT (supraventricular tachycardia) 06/19/2010   Precordial pain 06/19/2010    1. Obstructive chronic bronchitis without exacerbation  Patient has a high risk factor for screening will need to get a CT low-dose for follow-up due to ongoing tobacco use.  I did spoke to her at length about smoking cessation - CT CHEST LUNG CA SCREEN LOW DOSE W/O CM; Future  2. Obesity, morbid (HCC) Obesity Counseling: Had a lengthy discussion regarding patients BMI and weight issues. Patient was instructed on portion control as well as increased activity. Also discussed caloric restrictions with trying to maintain intake less than 2000 Kcal. Discussions were made in accordance with the 5As of weight management. Simple actions such as not eating late and if able to, taking a walk is suggested.   3. Smoker in home She needs to stop smoking we discussed different methods that are available.  She will make a good faith effort - CT CHEST LUNG CA SCREEN LOW DOSE W/O CM; Future  4. Shortness of breath Possible COPD along with history of tobacco use we will go ahead and get pulmonary function studies scheduled for her. - Pulmonary function test; Future    General Counseling: I have discussed the findings of the evaluation and examination with Jakaria.  I have also discussed any further diagnostic evaluation thatmay be needed or ordered today. Ysabel verbalizes understanding of the findings of todays visit. We also reviewed her medications today and discussed drug interactions and side effects including but not limited excessive drowsiness and altered mental states. We also  discussed that there is always a risk not just to her but also people around her. she has been encouraged to call the office with any questions or concerns that should arise related to todays visit.  Orders Placed This Encounter  Procedures   CT CHEST LUNG CA SCREEN LOW DOSE W/O CM    Standing Status:   Future    Standing Expiration Date:   06/28/2024    Order Specific Question:   Preferred Imaging Location?    Answer:   [facility]    Order Specific Question:   Is patient pregnant?    Answer:   No   Pulmonary function test    Standing Status:   Future    Standing Expiration Date:   06/28/2024    Order Specific Question:   Where should this test be performed?    Answer:   [facility]     Time spent: 25  I have personally obtained a history, examined the patient, evaluated laboratory and imaging results, formulated the assessment and plan and placed orders.    Yevonne Pax, MD [facility] Pulmonary and Critical Care Sleep medicine

## 2023-06-29 NOTE — Patient Instructions (Signed)
Chronic Obstructive Pulmonary Disease  Chronic obstructive pulmonary disease (COPD) is a long-term (chronic) lung problem. When you have COPD, it is hard for air to get in and out of your lungs. Usually the condition gets worse over time, and your lungs will never return to normal. There are things you can do to keep yourself as healthy as possible. What are the causes? Smoking. This is the most common cause. Certain genes passed from parent to child (inherited). What increases the risk? Being exposed to secondhand smoke from cigarettes, pipes, or cigars. Being exposed to chemicals and other irritants, such as fumes and dust in the work environment. Having chronic lung conditions or infections. What are the signs or symptoms? Shortness of breath, especially during physical activity. A long-term cough with a large amount of thick mucus. Sometimes, the cough may not have any mucus (dry cough). Wheezing. Breathing quickly. Skin that looks gray or blue, especially in the fingers, toes, or lips. Feeling tired (fatigue). Weight loss. Chest tightness. Having infections often. Episodes when breathing symptoms become much worse (exacerbations). At the later stages of this disease, you may have swelling in the ankles, feet, or legs. How is this treated? Taking medicines. Quitting smoking, if you smoke. Rehabilitation. This includes steps to make your body work better. It may involve a team of specialists. Doing exercises. Making changes to your diet. Using oxygen. Lung surgery. Lung transplant. Comfort measures (palliative care). Follow these instructions at home: Medicines Take over-the-counter and prescription medicines only as told by your doctor. Talk to your doctor before taking any cough or allergy medicines. You may need to avoid medicines that cause your lungs to be dry. Lifestyle If you smoke, stop smoking. Smoking makes the problem worse. Do not smoke or use any products that  contain nicotine or tobacco. If you need help quitting, ask your doctor. Avoid being around things that make your breathing worse. This may include smoke, chemicals, and fumes. Stay active, but remember to rest as well. Learn and use tips on how to manage stress and control your breathing. Make sure you get enough sleep. Most adults need at least 7 hours of sleep every night. Eat healthy foods. Eat smaller meals more often. Rest before meals. Controlled breathing Learn and use tips on how to control your breathing as told by your doctor. Try: Breathing in (inhaling) through your nose for 1 second. Then, pucker your lips and breath out (exhale) through your lips for 2 seconds. Putting one hand on your belly (abdomen). Breathe in slowly through your nose for 1 second. Your hand on your belly should move out. Pucker your lips and breathe out slowly through your lips. Your hand on your belly should move in as you breathe out.  Controlled coughing Learn and use controlled coughing to clear mucus from your lungs. Follow these steps: Lean your head a little forward. Breathe in deeply. Try to hold your breath for 3 seconds. Keep your mouth slightly open while coughing 2 times. Spit any mucus out into a tissue. Rest and do the steps again 1 or 2 times as needed. General instructions Make sure you get all the shots (vaccines) that your doctor recommends. Ask your doctor about a flu shot and a pneumonia shot. Use oxygen therapy and pulmonary rehabilitation if told by your doctor. If you need home oxygen therapy, ask your doctor if you should buy a tool to measure your oxygen level (oximeter). Make a COPD action plan with your doctor. This helps you   to know what to do if you feel worse than usual. Manage any other conditions you have as told by your doctor. Avoid going outside when it is very hot, cold, or humid. Avoid people who have a sickness you can catch (contagious). Keep all follow-up  visits. Contact a doctor if: You cough up more mucus than usual. There is a change in the color or thickness of the mucus. It is harder to breathe than usual. Your breathing is faster than usual. You have trouble sleeping. You need to use your medicines more often than usual. You have trouble doing your normal activities such as getting dressed or walking around the house. Get help right away if: You have shortness of breath while resting. You have shortness of breath that stops you from: Being able to talk. Doing normal activities. Your chest hurts for longer than 5 minutes. Your skin color is more blue than usual. Your pulse oximeter shows that you have low oxygen for longer than 5 minutes. You have a fever. You feel too tired to breathe normally. These symptoms may represent a serious problem that is an emergency. Do not wait to see if the symptoms will go away. Get medical help right away. Call your local emergency services (911 in the [place] Do not drive yourself to the hospital. Summary Chronic obstructive pulmonary disease (COPD) is a long-term lung problem. The way your lungs work will never return to normal. Usually the condition gets worse over time. There are things you can do to keep yourself as healthy as possible. Take over-the-counter and prescription medicines only as told by your doctor. If you smoke, stop. Smoking makes the problem worse. This information is not intended to replace advice given to you by your health care provider. Make sure you discuss any questions you have with your health care provider. Document Revised: 10/14/2020 Document Reviewed: 10/15/2020 Elsevier Patient Education  2024 [employer]  

## 2023-06-30 ENCOUNTER — Telehealth: Payer: Self-pay | Admitting: Internal Medicine

## 2023-06-30 NOTE — Telephone Encounter (Signed)
Chest CT order faxed to [facility]

## 2023-07-14 ENCOUNTER — Ambulatory Visit
Admission: RE | Admit: 2023-07-14 | Discharge: 2023-07-14 | Disposition: A | Discharge status: Home / Self Care | Payer: BC Managed Care – PPO | Source: Ambulatory Visit | Attending: Internal Medicine | Admitting: Internal Medicine

## 2023-07-14 ENCOUNTER — Encounter: Payer: BC Managed Care – PPO | Admitting: Internal Medicine

## 2023-07-14 DIAGNOSIS — F17210 Nicotine dependence, cigarettes, uncomplicated: Secondary | ICD-10-CM | POA: Diagnosis not present

## 2023-07-14 DIAGNOSIS — Z7722 Contact with and (suspected) exposure to environmental tobacco smoke (acute) (chronic): Secondary | ICD-10-CM

## 2023-07-14 DIAGNOSIS — J4489 Other specified chronic obstructive pulmonary disease: Secondary | ICD-10-CM

## 2023-07-28 ENCOUNTER — Encounter: Payer: BC Managed Care – PPO | Admitting: Internal Medicine

## 2023-08-03 ENCOUNTER — Other Ambulatory Visit: Payer: Self-pay

## 2023-08-03 MED ORDER — ALBUTEROL SULFATE HFA 108 (90 BASE) MCG/ACT IN AERS: 2.0000 | INHALATION_SPRAY | Freq: Four times a day (QID) | RESPIRATORY_TRACT | 0 refills | Status: DC | PRN

## 2023-08-10 ENCOUNTER — Ambulatory Visit: Payer: BC Managed Care – PPO | Admitting: Internal Medicine

## 2023-08-23 ENCOUNTER — Other Ambulatory Visit: Payer: Self-pay | Admitting: Nurse Practitioner

## 2023-08-30 ENCOUNTER — Encounter: Payer: Self-pay | Admitting: Nurse Practitioner

## 2023-08-30 ENCOUNTER — Ambulatory Visit (INDEPENDENT_AMBULATORY_CARE_PROVIDER_SITE_OTHER): Payer: BC Managed Care – PPO | Admitting: Nurse Practitioner

## 2023-08-30 VITALS — BP 135/80 | HR 83 | Temp 98.9°F | Resp 16 | Ht 64.0 in | Wt 165.2 lb

## 2023-08-30 DIAGNOSIS — R7301 Impaired fasting glucose: Secondary | ICD-10-CM

## 2023-08-30 DIAGNOSIS — I471 Supraventricular tachycardia, unspecified: Secondary | ICD-10-CM

## 2023-08-30 DIAGNOSIS — M797 Fibromyalgia: Secondary | ICD-10-CM

## 2023-08-30 DIAGNOSIS — J4489 Other specified chronic obstructive pulmonary disease: Secondary | ICD-10-CM | POA: Diagnosis not present

## 2023-08-30 DIAGNOSIS — E782 Mixed hyperlipidemia: Secondary | ICD-10-CM

## 2023-08-30 DIAGNOSIS — E538 Deficiency of other specified B group vitamins: Secondary | ICD-10-CM

## 2023-08-30 DIAGNOSIS — Z0000 Encounter for general adult medical examination without abnormal findings: Secondary | ICD-10-CM

## 2023-08-30 DIAGNOSIS — E559 Vitamin D deficiency, unspecified: Secondary | ICD-10-CM

## 2023-08-30 DIAGNOSIS — R5383 Other fatigue: Secondary | ICD-10-CM

## 2023-08-30 LAB — POCT GLYCOSYLATED HEMOGLOBIN (HGB A1C): Hemoglobin A1C: 5.3 % (ref 4.0–5.6)

## 2023-08-30 NOTE — Progress Notes (Signed)
[facility] [address] [place], [place] [address]  Internal MEDICINE  Office Visit Note  Patient Name: Amy Ferrell  [id number]  [id number]  Date of Service: 08/30/2023   Complaints/HPI Pt is here for establishment of PCP. Chief Complaint  Patient presents with   New Patient (Initial Visit)    Very tired, urination frequently.   Hypertension    HPI [name] presents for a new patient visit to establish care.  Well-appearing 57 y.o. female with COPD, fibromyalgia, chronic back pain, SVT, GERD, and depression.  Work: work from home  Home:lives with husband, has daughter in college Diet:  work in progress.  Exercise: none Tobacco use: 1 ppd  Alcohol use: sometimes 1-2 drinks on weekend.  Illicit drug use: marijuana -- smoke sometimes once a week.  Routine CRC screening:  jan  Routine mammogram: due jan Labs: due fro routine labs  New or worsening pain: Intermittent leave for job, sees DSK Chronic pain , low back pain, takes tylenol and ibuprofen daily. Fell Friday on left knee. Has history of fibromyalgia COPD -- sees Dr. Freda Munro, on breztri.  Polyuria -- and impaired fasting glucose, check for diabetes     Current Medication: Outpatient Encounter Medications as of 08/30/2023  Medication Sig   albuterol (PROVENTIL) (2.5 MG/3ML) 0.083% nebulizer solution Take 3 mLs (2.5 mg total) by nebulization every 4 (four) hours as needed for wheezing or shortness of breath.   amLODipine (NORVASC) 10 MG tablet Take 10 mg by mouth daily.   Budeson-Glycopyrrol-Formoterol (BREZTRI AEROSPHERE) 160-9-4.8 MCG/ACT AERO Inhale 2 puffs into the lungs 2 (two) times daily.   HYDROcodone-homatropine (HYCODAN) 5-1.5 MG/5ML syrup Take 5 mLs by mouth every 6 (six) hours as needed for cough.   LORazepam (ATIVAN) 1 MG tablet Take 1 tablet (1 mg total) by mouth every 8 (eight) hours as needed for anxiety.   nitrofurantoin, macrocrystal-monohydrate, (MACROBID) 100 MG capsule Take 1  capsule (100 mg total) by mouth 2 (two) times daily.   omeprazole (PRILOSEC) 40 MG capsule Take 40 mg by mouth 2 (two) times daily.     [DISCONTINUED] albuterol (VENTOLIN HFA) 108 (90 Base) MCG/ACT inhaler INHALE 2 PUFFS BY MOUTH EVERY 6 HOURS AS NEEDED   No facility-administered encounter medications on file as of 08/30/2023.    Surgical History: Past Surgical History:  Procedure Laterality Date   ABDOMINAL HYSTERECTOMY     appebdectomy     APPENDECTOMY     CESAREAN SECTION     x3    CHOLECYSTECTOMY     LAPAROSCOPIC SALPINGO OOPHERECTOMY Right 10/16/2019   Procedure: LAPAROSCOPIC SALPINGO OOPHORECTOMY;  Surgeon: Linzie Collin, MD;  Location: [facility] ORS;  Service: Gynecology;  Laterality: Right;   VESICOVAGINAL FISTULA CLOSURE W/ TAH      Medical History: Past Medical History:  Diagnosis Date   Complication of anesthesia    2004 spinal did not work    COPD (chronic obstructive pulmonary disease) (HCC)    Depression    Essential hypertension    Family history of adverse reaction to anesthesia    mom slow to wake up   GERD (gastroesophageal reflux disease)    Hypertension    Moderate aortic insufficiency    a. 02/2023 Echo: EF 60-65%, no rwma, nl RV fxn, RVSP . Mild MR. Mod AI.   Nonosbstructive CAD (coronary artery disease)    a. 01/2023 Cor CTA: Ca2+ = 18.6 (85th%'ile). LM nl, LAD <25p, LCX nl, RCA nl.   Ovarian tumor  PSVT (paroxysmal supraventricular tachycardia) (HCC)    a. 01/2023 Zio: Predominantly sinus rhythm with average of 56 (56-123).  Rare PACs and PVCs.  7 brief atrial runs (fastest 19 beats up to 174).  Symptoms noted with 5 SVT runs and PACs.   Renal disorder    conjoined kidney   UTI (urinary tract infection)     Family History: Family History  Problem Relation Age of Onset   Hypertension Mother    Ovarian cancer Mother    Congestive Heart Failure Mother    Aortic aneurysm Maternal Grandmother    Heart attack Maternal Grandmother         Apparently had several.   Coronary artery disease Other        GF   COPD Maternal Aunt        Lymphedema   Breast cancer Neg Hx     Social History   Socioeconomic History   Marital status: Married    Spouse name: Not on file   Number of children: Not on file   Years of education: Not on file   Highest education level: Not on file  Occupational History   Not on file  Tobacco Use   Smoking status: Every Day    Current packs/day: 0.00    Average packs/day: 1 pack/day for 37.0 years (37.0 ttl pk-yrs)    Types: Cigarettes    Start date: 11/17/1983    Last attempt to quit: 11/16/2020    Years since quitting: 2.9   Smokeless tobacco: Never   Tobacco comments:    Smokes over a pack a day.  Vaping Use   Vaping status: Never Used  Substance and Sexual Activity   Alcohol use: Yes    Comment: occasional   Drug use: Yes    Types: Marijuana    Comment: occasional   Sexual activity: Yes    Birth control/protection: Surgical  Other Topics Concern   Not on file  Social History Narrative   Single.    Social Determinants of Health   Financial Resource Strain: Not on file  Food Insecurity: Not on file  Transportation Needs: Not on file  Physical Activity: Not on file  Stress: Not on file  Social Connections: Not on file  Intimate Partner Violence: Not on file     Review of Systems  Constitutional:  Negative for chills, fatigue and unexpected weight change.  HENT:  Negative for congestion, postnasal drip, rhinorrhea, sneezing and sore throat.   Eyes:  Negative for redness.  Respiratory:  Positive for cough and shortness of breath. Negative for chest tightness and wheezing.   Cardiovascular: Negative.  Negative for chest pain and palpitations.  Gastrointestinal:  Negative for abdominal pain, constipation, diarrhea, nausea and vomiting.  Genitourinary:  Negative for dysuria and frequency.  Musculoskeletal:  Positive for arthralgias and back pain. Negative for joint swelling  and neck pain.  Skin:  Negative for rash.  Neurological: Negative.  Negative for tremors and numbness.  Hematological:  Negative for adenopathy. Does not bruise/bleed easily.  Psychiatric/Behavioral:  Positive for behavioral problems (Depression) and sleep disturbance. Negative for self-injury and suicidal ideas. The patient is nervous/anxious.     Vital Signs: BP (!) 160/90   Pulse 83   Temp 98.9 F (37.2 C)   Resp 16   Ht 5\' 4"  (1.626 m)   Wt 165 lb 3.2 oz (74.9 kg)   SpO2 98%   BMI 28.36 kg/m    Physical Exam Vitals reviewed.  Constitutional:  Appearance: Normal appearance.  HENT:     Head: Normocephalic and atraumatic.  Eyes:     Pupils: Pupils are equal, round, and reactive to light.  Cardiovascular:     Rate and Rhythm: Normal rate and regular rhythm.  Skin:    Capillary Refill: Capillary refill takes less than 2 seconds.  Neurological:     Mental Status: She is alert and oriented to person, place, and time.  Psychiatric:        Mood and Affect: Mood normal.        Behavior: Behavior normal.       Assessment/Plan: 1. Obstructive chronic bronchitis without exacerbation Continue breztri inhaler as prescribed.   2. Fibromyalgia Continue to monitor   3. Mixed hyperlipidemia Routine labs ordered  - CBC with Differential/Platelet - CMP14+EGFR - Lipid Profile - TSH+T4F+T3Free+ThyAbs+TPO+VD25 - B12 and Folate Panel - Iron, TIBC and Ferritin Panel  4. Impaired fasting glucose A1c checked and is normal at 5.3 - POCT glycosylated hemoglobin (Hb A1C)  5. Other fatigue Routine labs ordered  - CBC with Differential/Platelet - CMP14+EGFR - Lipid Profile - TSH+T4F+T3Free+ThyAbs+TPO+VD25 - B12 and Folate Panel - Iron, TIBC and Ferritin Panel  6. B12 deficiency Routine labs ordered  - CBC with Differential/Platelet - CMP14+EGFR - Lipid Profile - TSH+T4F+T3Free+ThyAbs+TPO+VD25 - B12 and Folate Panel - Iron, TIBC and Ferritin Panel  7. Vitamin D  deficiency Routine labs ordered  - CBC with Differential/Platelet - CMP14+EGFR - Lipid Profile - TSH+T4F+T3Free+ThyAbs+TPO+VD25 - B12 and Folate Panel - Iron, TIBC and Ferritin Panel  8. Paroxysmal SVT (supraventricular tachycardia) Routine labs ordered  - CBC with Differential/Platelet - CMP14+EGFR - Lipid Profile - TSH+T4F+T3Free+ThyAbs+TPO+VD25 - B12 and Folate Panel - Iron, TIBC and Ferritin Panel  9. Routine health maintenance Routine labs ordered  - CBC with Differential/Platelet - CMP14+EGFR - Lipid Profile - TSH+T4F+T3Free+ThyAbs+TPO+VD25 - B12 and Folate Panel - Iron, TIBC and Ferritin Panel    General Counseling: Rhodie verbalizes understanding of the findings of todays visit and agrees with plan of treatment. I have discussed any further diagnostic evaluation that may be needed or ordered today. We also reviewed her medications today. she has been encouraged to call the office with any questions or concerns that should arise related to todays visit.    Orders Placed This Encounter  Procedures   CBC with Differential/Platelet   CMP14+EGFR   Lipid Profile   TSH+T4F+T3Free+ThyAbs+TPO+VD25   B12 and Folate Panel   Iron, TIBC and Ferritin Panel   POCT glycosylated hemoglobin (Hb A1C)    No orders of the defined types were placed in this encounter.   Return for CPE, Lamia Mariner PCP at earliest aavailable opening, have labs done prior to visit. .  Time spent:30 Minutes Time spent with patient included reviewing progress notes, labs, imaging studies, and discussing plan for follow up.   [place] Controlled Substance Database was reviewed by me for overdose risk score (ORS)   This patient was seen by Sallyanne Kuster, FNP-C in collaboration with Dr. Beverely Risen as a part of collaborative care agreement.   Ladawn Boullion R. Tedd Sias, MSN, FNP-C Internal Medicine

## 2023-08-31 ENCOUNTER — Telehealth: Payer: Self-pay | Admitting: Nurse Practitioner

## 2023-08-31 NOTE — Telephone Encounter (Signed)
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 Notified patient ready to p/u. Scanned-Toni

## 2023-09-07 ENCOUNTER — Ambulatory Visit: Payer: BC Managed Care – PPO | Admitting: Internal Medicine

## 2023-09-07 DIAGNOSIS — J449 Chronic obstructive pulmonary disease, unspecified: Secondary | ICD-10-CM | POA: Diagnosis not present

## 2023-09-08 ENCOUNTER — Telehealth: Payer: Self-pay | Admitting: Nurse Practitioner

## 2023-09-08 ENCOUNTER — Encounter: Payer: Self-pay | Admitting: Nurse Practitioner

## 2023-09-08 NOTE — Telephone Encounter (Signed)
Emailed work note to patient. Scanned-Toni

## 2023-09-20 DIAGNOSIS — Z0000 Encounter for general adult medical examination without abnormal findings: Secondary | ICD-10-CM | POA: Diagnosis not present

## 2023-09-20 DIAGNOSIS — E559 Vitamin D deficiency, unspecified: Secondary | ICD-10-CM | POA: Diagnosis not present

## 2023-09-20 DIAGNOSIS — I471 Supraventricular tachycardia, unspecified: Secondary | ICD-10-CM | POA: Diagnosis not present

## 2023-09-20 DIAGNOSIS — E538 Deficiency of other specified B group vitamins: Secondary | ICD-10-CM | POA: Diagnosis not present

## 2023-09-20 DIAGNOSIS — R5383 Other fatigue: Secondary | ICD-10-CM | POA: Diagnosis not present

## 2023-09-20 DIAGNOSIS — E782 Mixed hyperlipidemia: Secondary | ICD-10-CM | POA: Diagnosis not present

## 2023-09-21 ENCOUNTER — Encounter: Payer: Self-pay | Admitting: Nurse Practitioner

## 2023-09-21 LAB — IRON,TIBC AND FERRITIN PANEL
Ferritin: 90 ng/mL (ref 15–150)
Iron Saturation: 16 % (ref 15–55)
Iron: 48 ug/dL (ref 27–159)
Total Iron Binding Capacity: 308 ug/dL (ref 250–450)
UIBC: 260 ug/dL (ref 131–425)

## 2023-09-21 LAB — LIPID PANEL
Chol/HDL Ratio: 4.1 {ratio} (ref 0.0–4.4)
Cholesterol, Total: 205 mg/dL — ABNORMAL HIGH (ref 100–199)
HDL: 50 mg/dL (ref >39)
LDL Chol Calc (NIH): 115 mg/dL — ABNORMAL HIGH (ref 0–99)
Triglycerides: 230 mg/dL — ABNORMAL HIGH (ref 0–149)
VLDL Cholesterol Cal: 40 mg/dL (ref 5–40)

## 2023-09-21 LAB — CBC WITH DIFFERENTIAL/PLATELET
Basophils Absolute: 0 10*3/uL (ref 0.0–0.2)
Basos: 0 %
EOS (ABSOLUTE): 0.2 10*3/uL (ref 0.0–0.4)
Eos: 2 %
Hematocrit: 42.6 % (ref 34.0–46.6)
Hemoglobin: 14.2 g/dL (ref 11.1–15.9)
Immature Grans (Abs): 0 10*3/uL (ref 0.0–0.1)
Immature Granulocytes: 0 %
Lymphocytes Absolute: 2.4 10*3/uL (ref 0.7–3.1)
Lymphs: 18 %
MCH: 34 pg — ABNORMAL HIGH (ref 26.6–33.0)
MCHC: 33.3 g/dL (ref 31.5–35.7)
MCV: 102 fL — ABNORMAL HIGH (ref 79–97)
Monocytes Absolute: 0.8 10*3/uL (ref 0.1–0.9)
Monocytes: 6 %
Neutrophils Absolute: 9.9 10*3/uL — ABNORMAL HIGH (ref 1.4–7.0)
Neutrophils: 74 %
Platelets: 407 10*3/uL (ref 150–450)
RBC: 4.18 x10E6/uL (ref 3.77–5.28)
RDW: 12.1 % (ref 11.7–15.4)
WBC: 13.3 10*3/uL — ABNORMAL HIGH (ref 3.4–10.8)

## 2023-09-21 LAB — TSH+T4F+T3FREE+THYABS+TPO+VD25
Free T4: 1.17 ng/dL (ref 0.82–1.77)
T3, Free: 2.9 pg/mL (ref 2.0–4.4)
TSH: 0.968 u[IU]/mL (ref 0.450–4.500)
Thyroglobulin Antibody: <1 [IU]/mL (ref 0.0–0.9)
Thyroperoxidase Ab SerPl-aCnc: <9 [IU]/mL (ref 0–34)
Vit D, 25-Hydroxy: 24.3 ng/mL — ABNORMAL LOW (ref 30.0–100.0)

## 2023-09-21 LAB — CMP14+EGFR
ALT: 10 [IU]/L (ref 0–32)
AST: 15 [IU]/L (ref 0–40)
Albumin: 4.1 g/dL (ref 3.8–4.9)
Alkaline Phosphatase: 119 [IU]/L (ref 44–121)
BUN/Creatinine Ratio: 17 (ref 9–23)
BUN: 10 mg/dL (ref 6–24)
Bilirubin Total: 0.2 mg/dL (ref 0.0–1.2)
CO2: 23 mmol/L (ref 20–29)
Calcium: 9.6 mg/dL (ref 8.7–10.2)
Chloride: 104 mmol/L (ref 96–106)
Creatinine, Ser: 0.58 mg/dL (ref 0.57–1.00)
Globulin, Total: 3 g/dL (ref 1.5–4.5)
Glucose: 94 mg/dL (ref 70–99)
Potassium: 4 mmol/L (ref 3.5–5.2)
Sodium: 142 mmol/L (ref 134–144)
Total Protein: 7.1 g/dL (ref 6.0–8.5)
eGFR: 107 mL/min/{1.73_m2} (ref >59)

## 2023-09-21 LAB — B12 AND FOLATE PANEL
Folate: 5.6 ng/mL (ref >3.0)
Vitamin B-12: 379 pg/mL (ref 232–1245)

## 2023-09-23 ENCOUNTER — Telehealth: Payer: Self-pay | Admitting: Nurse Practitioner

## 2023-09-27 ENCOUNTER — Other Ambulatory Visit: Payer: Self-pay | Admitting: Nurse Practitioner

## 2023-09-27 NOTE — Telephone Encounter (Signed)
Error

## 2023-09-28 ENCOUNTER — Encounter: Payer: Self-pay | Admitting: Nurse Practitioner

## 2023-09-28 ENCOUNTER — Ambulatory Visit (INDEPENDENT_AMBULATORY_CARE_PROVIDER_SITE_OTHER): Payer: BC Managed Care – PPO | Admitting: Nurse Practitioner

## 2023-09-28 VITALS — BP 135/86 | HR 78 | Temp 98.4°F | Resp 16 | Ht 64.0 in | Wt 167.8 lb

## 2023-09-28 DIAGNOSIS — I10 Essential (primary) hypertension: Secondary | ICD-10-CM

## 2023-09-28 DIAGNOSIS — M5441 Lumbago with sciatica, right side: Secondary | ICD-10-CM | POA: Diagnosis not present

## 2023-09-28 DIAGNOSIS — E538 Deficiency of other specified B group vitamins: Secondary | ICD-10-CM

## 2023-09-28 DIAGNOSIS — M5442 Lumbago with sciatica, left side: Secondary | ICD-10-CM

## 2023-09-28 DIAGNOSIS — I700 Atherosclerosis of aorta: Secondary | ICD-10-CM

## 2023-09-28 DIAGNOSIS — M797 Fibromyalgia: Secondary | ICD-10-CM

## 2023-09-28 DIAGNOSIS — G8929 Other chronic pain: Secondary | ICD-10-CM

## 2023-09-28 DIAGNOSIS — E559 Vitamin D deficiency, unspecified: Secondary | ICD-10-CM

## 2023-09-28 DIAGNOSIS — L739 Follicular disorder, unspecified: Secondary | ICD-10-CM

## 2023-09-28 MED ORDER — TRAMADOL HCL 50 MG PO TABS: 50.0000 mg | ORAL_TABLET | Freq: Four times a day (QID) | ORAL | 0 refills | Status: DC | PRN

## 2023-09-28 MED ORDER — MUPIROCIN 2 % EX OINT
1.0000 | TOPICAL_OINTMENT | Freq: Every day | CUTANEOUS | 2 refills | Status: DC
Start: 2023-09-28 — End: 2024-07-22

## 2023-09-28 NOTE — Progress Notes (Cosign Needed)
[facility] [address] [place], [place] [address]  Internal MEDICINE  Office Visit Note  Patient Name: Amy Ferrell  [id number]  [id number]  Date of Service: 09/28/2023  Chief Complaint  Patient presents with   Follow-up    Labs    COPD   Depression   Hypertension    HPI Falicia presents for a follow-up visit for IBS, chronic pain, fibromyalgia and lab results. IBS? Alternating constipation and diarrhea Chronic pain -- lower back, but feels pain generalized all over, reports chronic neck pain, and arms are numb, has been going on for years. Reports prior diagnosis of fibromyalgia.  SOB related to COPD Vitamin D low and low normal B12  Abnormal cbc-- has been evaluated by hematology in past and nothing was found  High cholesterol  Slightly elevated BP, improved when rechecked  Medications tried for fibromyalgia and chronic pain: Lyrica, gabapentin, amitriptyline, duloxetine  The 10-year ASCVD risk score (Arnett DK, et al., 2019) is: 6.9%   Values used to calculate the score:     Age: 57 years     Sex: Female     Is Non-Hispanic African American: No     Diabetic: No     Tobacco smoker: Yes     Systolic Blood Pressure: 125 mmHg     Is BP treated: Yes     HDL Cholesterol: 50 mg/dL     Total Cholesterol: 205 mg/dL     Current Medication: Outpatient Encounter Medications as of 09/28/2023  Medication Sig   albuterol (PROVENTIL) (2.5 MG/3ML) 0.083% nebulizer solution Take 3 mLs (2.5 mg total) by nebulization every 4 (four) hours as needed for wheezing or shortness of breath.   albuterol (VENTOLIN HFA) 108 (90 Base) MCG/ACT inhaler INHALE 2 PUFFS BY MOUTH EVERY 6 HOURS AS NEEDED   amLODipine (NORVASC) 10 MG tablet Take 10 mg by mouth daily.   Budeson-Glycopyrrol-Formoterol (BREZTRI AEROSPHERE) 160-9-4.8 MCG/ACT AERO Inhale 2 puffs into the lungs 2 (two) times daily.   HYDROcodone-homatropine (HYCODAN) 5-1.5 MG/5ML syrup Take 5 mLs by mouth every 6 (six)  hours as needed for cough.   LORazepam (ATIVAN) 1 MG tablet Take 1 tablet (1 mg total) by mouth every 8 (eight) hours as needed for anxiety.   mupirocin ointment (BACTROBAN) 2 % Apply 1 Application topically daily. To affected area of inner thighs until healed   nitrofurantoin, macrocrystal-monohydrate, (MACROBID) 100 MG capsule Take 1 capsule (100 mg total) by mouth 2 (two) times daily.   omeprazole (PRILOSEC) 40 MG capsule Take 40 mg by mouth 2 (two) times daily.     [DISCONTINUED] traMADol (ULTRAM) 50 MG tablet Take 1 tablet (50 mg total) by mouth every 6 (six) hours as needed for severe pain.   No facility-administered encounter medications on file as of 09/28/2023.    Surgical History: Past Surgical History:  Procedure Laterality Date   ABDOMINAL HYSTERECTOMY     appebdectomy     APPENDECTOMY     CESAREAN SECTION     x3    CHOLECYSTECTOMY     LAPAROSCOPIC SALPINGO OOPHERECTOMY Right 10/16/2019   Procedure: LAPAROSCOPIC SALPINGO OOPHORECTOMY;  Surgeon: Linzie Collin, MD;  Location: [facility] ORS;  Service: Gynecology;  Laterality: Right;   VESICOVAGINAL FISTULA CLOSURE W/ TAH      Medical History: Past Medical History:  Diagnosis Date   Complication of anesthesia    2004 spinal did not work    COPD (chronic obstructive pulmonary disease) (HCC)    Depression  Essential hypertension    Family history of adverse reaction to anesthesia    mom slow to wake up   GERD (gastroesophageal reflux disease)    Hypertension    Moderate aortic insufficiency    a. 02/2023 Echo: EF 60-65%, no rwma, nl RV fxn, RVSP . Mild MR. Mod AI.   Nonosbstructive CAD (coronary artery disease)    a. 01/2023 Cor CTA: Ca2+ = 18.6 (85th%'ile). LM nl, LAD <25p, LCX nl, RCA nl.   Ovarian tumor    PSVT (paroxysmal supraventricular tachycardia) (HCC)    a. 01/2023 Zio: Predominantly sinus rhythm with average of 56 (56-123).  Rare PACs and PVCs.  7 brief atrial runs (fastest 19 beats up to 174).  Symptoms  noted with 5 SVT runs and PACs.   Renal disorder    conjoined kidney   UTI (urinary tract infection)     Family History: Family History  Problem Relation Age of Onset   Hypertension Mother    Ovarian cancer Mother    Congestive Heart Failure Mother    Aortic aneurysm Maternal Grandmother    Heart attack Maternal Grandmother        Apparently had several.   Coronary artery disease Other        GF   COPD Maternal Aunt        Lymphedema   Breast cancer Neg Hx     Social History   Socioeconomic History   Marital status: Married    Spouse name: Not on file   Number of children: Not on file   Years of education: Not on file   Highest education level: Not on file  Occupational History   Not on file  Tobacco Use   Smoking status: Every Day    Current packs/day: 0.00    Average packs/day: 1 pack/day for 37.0 years (37.0 ttl pk-yrs)    Types: Cigarettes    Start date: 11/17/1983    Last attempt to quit: 11/16/2020    Years since quitting: 2.9   Smokeless tobacco: Never   Tobacco comments:    Smokes over a pack a day.  Vaping Use   Vaping status: Never Used  Substance and Sexual Activity   Alcohol use: Not Currently    Comment: occasional   Drug use: Yes    Types: Marijuana    Comment: occasional   Sexual activity: Yes    Birth control/protection: Surgical  Other Topics Concern   Not on file  Social History Narrative   Single.    Social Determinants of Health   Financial Resource Strain: Not on file  Food Insecurity: Not on file  Transportation Needs: Not on file  Physical Activity: Not on file  Stress: Not on file  Social Connections: Not on file  Intimate Partner Violence: Not on file      Review of Systems  Constitutional:  Negative for chills, fatigue and unexpected weight change.  HENT:  Negative for congestion, postnasal drip, rhinorrhea, sneezing and sore throat.   Eyes:  Negative for redness.  Respiratory:  Positive for cough and shortness of  breath. Negative for chest tightness and wheezing.   Cardiovascular: Negative.  Negative for chest pain and palpitations.  Gastrointestinal:  Negative for abdominal pain, constipation, diarrhea, nausea and vomiting.  Genitourinary:  Negative for dysuria and frequency.  Musculoskeletal:  Positive for arthralgias and back pain. Negative for joint swelling and neck pain.  Skin:  Negative for rash.  Neurological: Negative.  Negative for tremors and numbness.  Hematological:  Negative for adenopathy. Does not bruise/bleed easily.  Psychiatric/Behavioral:  Positive for behavioral problems (Depression) and sleep disturbance. Negative for self-injury and suicidal ideas. The patient is nervous/anxious.     Vital Signs: BP 135/86 Comment: 156/84  Pulse 78   Temp 98.4 F (36.9 C)   Resp 16   Ht 5\' 4"  (1.626 m)   Wt 167 lb 12.8 oz (76.1 kg)   SpO2 98%   BMI 28.80 kg/m    Physical Exam Vitals reviewed.  Constitutional:      Appearance: Normal appearance.  HENT:     Head: Normocephalic and atraumatic.  Eyes:     Pupils: Pupils are equal, round, and reactive to light.  Cardiovascular:     Rate and Rhythm: Normal rate and regular rhythm.  Skin:    Capillary Refill: Capillary refill takes less than 2 seconds.  Neurological:     Mental Status: She is alert and oriented to person, place, and time.  Psychiatric:        Mood and Affect: Mood normal.        Behavior: Behavior normal.        Assessment/Plan: 1. Chronic midline low back pain with bilateral sciatica CT lumbar spine ordered  - CT Lumbar Spine Wo Contrast; Future  2. Fibromyalgia CT lumbar spine ordered  - CT Lumbar Spine Wo Contrast; Future  3. Elevated blood pressure reading in office with diagnosis of hypertension Improved when rechecked. Will continue to monitor periodically. No medication changes at this time, continue amlodipine as prescribed.   4. Aortic atherosclerosis (HCC) Discussed adding cholesterol  medication but patient declined. Will add fish oil supplement 1000 mg daily first and work on diet. Then reevaluate labs in a few months.   5. B12 deficiency Take OTC B12 1000 mcg daily.   6. Vitamin D deficiency Take OTC vitamin D 2000-5000 units daily   7. Folliculitis Topical mupirocin prescribed, apply daily until healed  - mupirocin ointment (BACTROBAN) 2 %; Apply 1 Application topically daily. To affected area of inner thighs until healed  Dispense: 30 g; Refill: 2   General Counseling: Malynn verbalizes understanding of the findings of todays visit and agrees with plan of treatment. I have discussed any further diagnostic evaluation that may be needed or ordered today. We also reviewed her medications today. she has been encouraged to call the office with any questions or concerns that should arise related to todays visit.    Orders Placed This Encounter  Procedures   CT Lumbar Spine Wo Contrast    Meds ordered this encounter  Medications   DISCONTD: traMADol (ULTRAM) 50 MG tablet    Sig: Take 1 tablet (50 mg total) by mouth every 6 (six) hours as needed for severe pain.    Dispense:  20 tablet    Refill:  0    First fill, may request refills.   mupirocin ointment (BACTROBAN) 2 %    Sig: Apply 1 Application topically daily. To affected area of inner thighs until healed    Dispense:  30 g    Refill:  2    Return in about 4 weeks (around 10/26/2023) for F/U, Law Corsino PCP CT scan results .   Total time spent:30 Minutes Time spent includes review of chart, medications, test results, and follow up plan with the patient.   [place] Controlled Substance Database was reviewed by me.  This patient was seen by Sallyanne Kuster, FNP-C in collaboration with Dr. Beverely Risen as a part of  collaborative care agreement.   Fontaine Kossman R. Tedd Sias, MSN, FNP-C Internal medicine

## 2023-09-28 NOTE — Patient Instructions (Signed)
Start  over the counter vitamin D 2000-5000 units daily   Start over the counter B12 1000 mcg daily  Start fish oil or flaxseed oil supplement 1-2 capsules daily.

## 2023-09-29 ENCOUNTER — Other Ambulatory Visit: Payer: Self-pay | Admitting: Nurse Practitioner

## 2023-10-01 ENCOUNTER — Inpatient Hospital Stay: Admission: RE | Admit: 2023-10-01 | Payer: BC Managed Care – PPO | Source: Ambulatory Visit

## 2023-10-05 ENCOUNTER — Encounter: Payer: Self-pay | Admitting: Nurse Practitioner

## 2023-10-07 ENCOUNTER — Other Ambulatory Visit: Payer: Self-pay | Admitting: Nurse Practitioner

## 2023-10-07 MED ORDER — TRAMADOL HCL 50 MG PO TABS: 100.0000 mg | ORAL_TABLET | Freq: Three times a day (TID) | ORAL | 2 refills | Status: DC | PRN

## 2023-10-13 ENCOUNTER — Inpatient Hospital Stay
Admission: RE | Admit: 2023-10-13 | Discharge: 2023-10-13 | Disposition: A | Discharge status: Home / Self Care | Payer: BC Managed Care – PPO | Source: Ambulatory Visit | Attending: Nurse Practitioner | Admitting: Nurse Practitioner

## 2023-10-13 ENCOUNTER — Encounter: Payer: Self-pay | Admitting: Nurse Practitioner

## 2023-10-13 ENCOUNTER — Telehealth: Payer: Self-pay | Admitting: Nurse Practitioner

## 2023-10-13 NOTE — Telephone Encounter (Signed)
Emailed work note to patient. Scanned-Toni

## 2023-10-16 ENCOUNTER — Encounter: Payer: Self-pay | Admitting: Nurse Practitioner

## 2023-10-19 ENCOUNTER — Ambulatory Visit: Payer: BC Managed Care – PPO | Admitting: Internal Medicine

## 2023-10-19 NOTE — Telephone Encounter (Signed)
Done

## 2023-10-21 ENCOUNTER — Encounter: Payer: BC Managed Care – PPO | Admitting: Nurse Practitioner

## 2023-10-25 ENCOUNTER — Inpatient Hospital Stay
Admission: RE | Admit: 2023-10-25 | Discharge: 2023-10-25 | Disposition: A | Discharge status: Home / Self Care | Payer: BC Managed Care – PPO | Source: Ambulatory Visit | Attending: Nurse Practitioner

## 2023-10-25 DIAGNOSIS — M545 Low back pain, unspecified: Secondary | ICD-10-CM | POA: Diagnosis not present

## 2023-10-25 DIAGNOSIS — M797 Fibromyalgia: Secondary | ICD-10-CM

## 2023-10-25 DIAGNOSIS — G8929 Other chronic pain: Secondary | ICD-10-CM

## 2023-11-02 ENCOUNTER — Telehealth: Payer: Self-pay

## 2023-11-02 ENCOUNTER — Other Ambulatory Visit: Payer: Self-pay

## 2023-11-02 ENCOUNTER — Encounter: Payer: Self-pay | Admitting: Nurse Practitioner

## 2023-11-02 MED ORDER — PREDNISONE 10 MG PO TABS: ORAL_TABLET | ORAL | 0 refills | Status: DC

## 2023-11-02 MED ORDER — AZITHROMYCIN 250 MG PO TABS: ORAL_TABLET | ORAL | 0 refills | Status: DC

## 2023-11-02 NOTE — Telephone Encounter (Signed)
Pt called she had copd Exacerbation  and bad coughing as per dr Welton Flakes sent prednisone and zpak and advised her used inhaler as prescribed and if she not feeling need appt with DSK

## 2023-11-09 ENCOUNTER — Encounter: Payer: Self-pay | Admitting: Emergency Medicine

## 2023-11-09 ENCOUNTER — Emergency Department
Admission: EM | Admit: 2023-11-09 | Discharge: 2023-11-09 | Disposition: A | Discharge status: Home / Self Care | Payer: BC Managed Care – PPO | Attending: Emergency Medicine | Admitting: Emergency Medicine

## 2023-11-09 ENCOUNTER — Other Ambulatory Visit: Payer: Self-pay

## 2023-11-09 ENCOUNTER — Emergency Department: Payer: BC Managed Care – PPO

## 2023-11-09 DIAGNOSIS — F17200 Nicotine dependence, unspecified, uncomplicated: Secondary | ICD-10-CM | POA: Insufficient documentation

## 2023-11-09 DIAGNOSIS — R059 Cough, unspecified: Secondary | ICD-10-CM | POA: Diagnosis not present

## 2023-11-09 DIAGNOSIS — J441 Chronic obstructive pulmonary disease with (acute) exacerbation: Secondary | ICD-10-CM

## 2023-11-09 DIAGNOSIS — Z20822 Contact with and (suspected) exposure to covid-19: Secondary | ICD-10-CM | POA: Diagnosis not present

## 2023-11-09 LAB — RESP PANEL BY RT-PCR (RSV, FLU A&B, COVID)  RVPGX2
Influenza A by PCR: NEGATIVE
Influenza B by PCR: NEGATIVE
Resp Syncytial Virus by PCR: NEGATIVE
SARS Coronavirus 2 by RT PCR: POSITIVE — AB

## 2023-11-09 MED ORDER — IPRATROPIUM-ALBUTEROL 0.5-2.5 (3) MG/3ML IN SOLN
6.0000 mL | Freq: Once | RESPIRATORY_TRACT | Status: AC
  Administered 2023-11-09: 6 mL via RESPIRATORY_TRACT
  Filled 2023-11-09: qty 6

## 2023-11-09 MED ORDER — PREDNISONE 10 MG (21) PO TBPK: ORAL_TABLET | ORAL | 0 refills | Status: AC

## 2023-11-09 MED ORDER — PREDNISONE 20 MG PO TABS
60.0000 mg | ORAL_TABLET | Freq: Once | ORAL | Status: AC
  Administered 2023-11-09: 60 mg via ORAL
  Filled 2023-11-09: qty 3

## 2023-11-09 MED ORDER — ALBUTEROL SULFATE (2.5 MG/3ML) 0.083% IN NEBU: 2.5000 mg | INHALATION_SOLUTION | RESPIRATORY_TRACT | 2 refills | Status: AC | PRN

## 2023-11-09 NOTE — ED Triage Notes (Signed)
Patient ambulatory to triage with steady gait, without difficulty or distress noted; pt reports for the last several days having prod cough brown sputum; +smoker, hx COPD; completed zpack and prednisone without relief of symptoms; denies fever or accomp symptoms

## 2023-11-09 NOTE — Discharge Instructions (Addendum)
STOP SMOKING  Steroid taper and albuterol nebulizer solution at the pharmacy

## 2023-11-09 NOTE — ED Provider Notes (Addendum)
[facility] Provider Note    Event Date/Time   First MD Initiated Contact with Patient 11/09/23 [phone]     (approximate)   History   Cough   HPI  Amy Ferrell is a 57 y.o. female who presents to the ED for evaluation of Cough   Review of PCP visit from 1 month ago.  COPD and continued smoking.  She presents to the ED alongside her husband for evaluation of a persistent cough.  Reports that her PCP phoned in 5 days of steroids and a Z-Pak last week that did not seem to improve her symptoms.  She has a nebulizer machine at home.  She has been compliant with her Jerrye Bushy daily inhaler  No syncope, chest pain, fever   Physical Exam   Triage Vital Signs: ED Triage Vitals  Encounter Vitals Group     BP 11/09/23 0553 125/67     Systolic BP Percentile --      Diastolic BP Percentile --      Pulse Rate 11/09/23 0553 76     Resp 11/09/23 0553 (!) 22     Temp 11/09/23 0553 98.9 F (37.2 C)     Temp Source 11/09/23 0553 Oral     SpO2 11/09/23 0553 96 %     Weight 11/09/23 0552 168 lb (76.2 kg)     Height 11/09/23 0552 5\' 4"  (1.626 m)     Head Circumference --      Peak Flow --      Pain Score 11/09/23 0552 0     Pain Loc --      Pain Education --      Exclude from Growth Chart --     Most recent vital signs: Vitals:   11/09/23 0553  BP: 125/67  Pulse: 76  Resp: (!) 22  Temp: 98.9 F (37.2 C)  SpO2: 96%    General: Awake, no distress.  Speaking in full sentences CV:  Good peripheral perfusion.  Resp:  Mild pursed lip breathing and tachypnea, wheezing throughout with decreased airflow Abd:  No distention.  MSK:  No deformity noted.  Neuro:  No focal deficits appreciated. Other:     ED Results / Procedures / Treatments   Labs (all labs ordered are listed, but only abnormal results are displayed) Labs Reviewed  RESP PANEL BY RT-PCR (RSV, FLU A&B, COVID)  [id number]    EKG   RADIOLOGY CXR interpreted by me without  evidence of acute cardiopulmonary pathology.  Official radiology report(s): DG Chest 2 View  Result Date: 11/09/2023 CLINICAL DATA:  Cough. EXAM: CHEST - 2 VIEW COMPARISON:  01/18/2023 FINDINGS: Normal heart size and mediastinal contours. No acute infiltrate or edema. 5 mm nodular density at the right base correlates with subpleural nodule in the right middle lobe on prior chest CT, recommendations given at that time. No effusion or pneumothorax. No acute osseous findings. IMPRESSION: No active cardiopulmonary disease. Electronically Signed   By: Tiburcio Pea M.D.   On: 11/09/2023 06:32    PROCEDURES and INTERVENTIONS:  Procedures  Medications  ipratropium-albuterol (DUONEB) 0.5-2.5 (3) MG/3ML nebulizer solution 6 mL (6 mLs Nebulization Given 11/09/23 1478)  predniSONE (DELTASONE) tablet 60 mg (60 mg Oral Given 11/09/23 2956)  ipratropium-albuterol (DUONEB) 0.5-2.5 (3) MG/3ML nebulizer solution 6 mL (6 mLs Nebulization Given 11/09/23 0655)     IMPRESSION / MDM / ASSESSMENT AND PLAN / ED COURSE  I reviewed the triage vital signs and the nursing notes.  Differential  diagnosis includes, but is not limited to, COPD exacerbation, pneumothorax, pneumonia, viral syndrome  {Patient presents with symptoms of an acute illness or injury that is potentially life-threatening.   Patient presents with evidence of a COPD exacerbation.  No hypoxia or distress.  CXR is clear.  Wheezing improving with breathing treatments.  We will get her back on steroids.  Suspect will be suitable for outpatient management after her second round of nebulizers  Reassessed after the second nebulizer and she is ready to go.  Discussed smoking cessation, steroid taper and return precautions  Clinical Course as of 11/09/23 0715  Tue Nov 09, 2023  0654 Reassessed.  Feeling better.  She does look more comfortable but is still wheezing on auscultation.  We discussed another nebulizer and she is agreeable. [DS]     Clinical Course User Index [DS] Delton Prairie, MD     FINAL CLINICAL IMPRESSION(S) / ED DIAGNOSES   Final diagnoses:  COPD exacerbation (HCC)     Rx / DC Orders   ED Discharge Orders          Ordered    albuterol (PROVENTIL) (2.5 MG/3ML) 0.083% nebulizer solution  Every 4 hours PRN        11/09/23 0652    predniSONE (STERAPRED UNI-PAK 21 TAB) 10 MG (21) TBPK tablet  Daily        11/09/23 1308             Note:  This document was prepared using Dragon voice recognition software and may include unintentional dictation errors.   Delton Prairie, MD 11/09/23 6578    Delton Prairie, MD 11/09/23 [phone]

## 2023-11-10 ENCOUNTER — Telehealth: Payer: Self-pay | Admitting: Nurse Practitioner

## 2023-11-10 ENCOUNTER — Telehealth (INDEPENDENT_AMBULATORY_CARE_PROVIDER_SITE_OTHER): Payer: BC Managed Care – PPO | Admitting: Nurse Practitioner

## 2023-11-10 ENCOUNTER — Encounter: Payer: Self-pay | Admitting: Nurse Practitioner

## 2023-11-10 VITALS — Resp 16 | Ht 64.0 in | Wt 168.0 lb

## 2023-11-10 DIAGNOSIS — J069 Acute upper respiratory infection, unspecified: Secondary | ICD-10-CM

## 2023-11-10 DIAGNOSIS — U071 COVID-19: Secondary | ICD-10-CM

## 2023-11-10 MED ORDER — PROMETHAZINE-DM 6.25-15 MG/5ML PO SYRP
5.0000 mL | ORAL_SOLUTION | Freq: Four times a day (QID) | ORAL | 0 refills | Status: DC | PRN
Start: 2023-11-10 — End: 2024-02-02

## 2023-11-10 MED ORDER — AZITHROMYCIN 250 MG PO TABS: ORAL_TABLET | ORAL | 0 refills | Status: AC

## 2023-11-10 NOTE — Progress Notes (Signed)
[facility] [address] [place], [place] [address]  Internal MEDICINE  Telephone Visit  Patient Name: Amy Ferrell  [id number]  [id number]  Date of Service: 11/10/2023  I connected with the patient at 1205 by telephone and verified the patients identity using two identifiers.   I discussed the limitations, risks, security and privacy concerns of performing an evaluation and management service by telephone and the availability of in person appointments. I also discussed with the patient that there may be a patient responsible charge related to the service.  The patient expressed understanding and agrees to proceed.    Chief Complaint  Patient presents with   Telephone Screen    Covid positive---Yesterday morning coughing, vomiting, fever. Extended steroids. Third day out of work.    Telephone Assessment    HPI Amy Ferrell presents for a telehealth virtual visit for covid URI. Symptoms started around 11/12 --reports coughing, vomiting, fever, weakness, fatigue, chills. --out of window for paxlovid  --still taking steroid taper.     Current Medication: Outpatient Encounter Medications as of 11/10/2023  Medication Sig   albuterol (PROVENTIL) (2.5 MG/3ML) 0.083% nebulizer solution Take 3 mLs (2.5 mg total) by nebulization every 4 (four) hours as needed for wheezing or shortness of breath.   albuterol (PROVENTIL) (2.5 MG/3ML) 0.083% nebulizer solution Take 3 mLs (2.5 mg total) by nebulization every 4 (four) hours as needed for wheezing or shortness of breath.   albuterol (VENTOLIN HFA) 108 (90 Base) MCG/ACT inhaler INHALE 2 PUFFS BY MOUTH EVERY 6 HOURS AS NEEDED   amLODipine (NORVASC) 10 MG tablet Take 10 mg by mouth daily.   azithromycin (ZITHROMAX) 250 MG tablet Take 2 tablets on day 1, then 1 tablet daily on days 2 through 5   Budeson-Glycopyrrol-Formoterol (BREZTRI AEROSPHERE) 160-9-4.8 MCG/ACT AERO Inhale 2 puffs into the lungs 2 (two) times daily.    HYDROcodone-homatropine (HYCODAN) 5-1.5 MG/5ML syrup Take 5 mLs by mouth every 6 (six) hours as needed for cough.   LORazepam (ATIVAN) 1 MG tablet Take 1 tablet (1 mg total) by mouth every 8 (eight) hours as needed for anxiety.   mupirocin ointment (BACTROBAN) 2 % Apply 1 Application topically daily. To affected area of inner thighs until healed   nitrofurantoin, macrocrystal-monohydrate, (MACROBID) 100 MG capsule Take 1 capsule (100 mg total) by mouth 2 (two) times daily.   omeprazole (PRILOSEC) 40 MG capsule Take 40 mg by mouth 2 (two) times daily.     predniSONE (DELTASONE) 10 MG tablet Use as directed for 6 days taper   predniSONE (STERAPRED UNI-PAK 21 TAB) 10 MG (21) TBPK tablet Take 4 tablets (40 mg total) by mouth daily for 5 days, THEN 3 tablets (30 mg total) daily for 2 days, THEN 2 tablets (20 mg total) daily for 2 days, THEN 1 tablet (10 mg total) daily for 2 days.   promethazine-dextromethorphan (PROMETHAZINE-DM) 6.25-15 MG/5ML syrup Take 5 mLs by mouth 4 (four) times daily as needed for cough.   traMADol (ULTRAM) 50 MG tablet Take 2 tablets (100 mg total) by mouth every 8 (eight) hours as needed for severe pain (pain score 7-10).   [DISCONTINUED] azithromycin (ZITHROMAX) 250 MG tablet Use as directed for 5 days   No facility-administered encounter medications on file as of 11/10/2023.    Surgical History: Past Surgical History:  Procedure Laterality Date   ABDOMINAL HYSTERECTOMY     appebdectomy     APPENDECTOMY     CESAREAN SECTION     x3  CHOLECYSTECTOMY     LAPAROSCOPIC SALPINGO OOPHERECTOMY Right 10/16/2019   Procedure: LAPAROSCOPIC SALPINGO OOPHORECTOMY;  Surgeon: Linzie Collin, MD;  Location: [facility] ORS;  Service: Gynecology;  Laterality: Right;   VESICOVAGINAL FISTULA CLOSURE W/ TAH      Medical History: Past Medical History:  Diagnosis Date   Complication of anesthesia    2004 spinal did not work    COPD (chronic obstructive pulmonary disease) (HCC)     Depression    Essential hypertension    Family history of adverse reaction to anesthesia    mom slow to wake up   GERD (gastroesophageal reflux disease)    Hypertension    Moderate aortic insufficiency    a. 02/2023 Echo: EF 60-65%, no rwma, nl RV fxn, RVSP . Mild MR. Mod AI.   Nonosbstructive CAD (coronary artery disease)    a. 01/2023 Cor CTA: Ca2+ = 18.6 (85th%'ile). LM nl, LAD <25p, LCX nl, RCA nl.   Ovarian tumor    PSVT (paroxysmal supraventricular tachycardia) (HCC)    a. 01/2023 Zio: Predominantly sinus rhythm with average of 56 (56-123).  Rare PACs and PVCs.  7 brief atrial runs (fastest 19 beats up to 174).  Symptoms noted with 5 SVT runs and PACs.   Renal disorder    conjoined kidney   UTI (urinary tract infection)     Family History: Family History  Problem Relation Age of Onset   Hypertension Mother    Ovarian cancer Mother    Congestive Heart Failure Mother    Aortic aneurysm Maternal Grandmother    Heart attack Maternal Grandmother        Apparently had several.   Coronary artery disease Other        GF   COPD Maternal Aunt        Lymphedema   Breast cancer Neg Hx     Social History   Socioeconomic History   Marital status: Married    Spouse name: Not on file   Number of children: Not on file   Years of education: Not on file   Highest education level: Not on file  Occupational History   Not on file  Tobacco Use   Smoking status: Every Day    Current packs/day: 0.00    Average packs/day: 1 pack/day for 37.0 years (37.0 ttl pk-yrs)    Types: Cigarettes    Start date: 11/17/1983    Last attempt to quit: 11/16/2020    Years since quitting: 2.9   Smokeless tobacco: Never   Tobacco comments:    Smokes over a pack a day.  Vaping Use   Vaping status: Never Used  Substance and Sexual Activity   Alcohol use: Not Currently    Comment: occasional   Drug use: Yes    Types: Marijuana    Comment: occasional   Sexual activity: Yes    Birth  control/protection: Surgical  Other Topics Concern   Not on file  Social History Narrative   Single.    Social Determinants of Health   Financial Resource Strain: Not on file  Food Insecurity: Not on file  Transportation Needs: Not on file  Physical Activity: Not on file  Stress: Not on file  Social Connections: Not on file  Intimate Partner Violence: Not on file      Review of Systems  Constitutional:  Positive for appetite change, chills, fatigue and fever.  HENT:  Positive for congestion, postnasal drip, rhinorrhea, sinus pressure, sinus pain, sneezing, sore throat and voice  change.   Respiratory:  Positive for cough, chest tightness, shortness of breath and wheezing.   Cardiovascular:  Negative for chest pain and palpitations.  Gastrointestinal:  Positive for diarrhea, nausea and vomiting. Negative for constipation.  Neurological:  Positive for headaches.    Vital Signs: Resp 16   Ht 5\' 4"  (1.626 m)   Wt 168 lb (76.2 kg)   BMI 28.84 kg/m    Observation/Objective: She is alert and oriented. No acute distress noted. She is heard coughing on audio, with deep coughing fits.     Assessment/Plan: 1. Upper respiratory tract infection due to COVID-19 virus Zpak prescribed, as well as promethazine DM cough syrup and recommended taking plain mucinex.  - promethazine-dextromethorphan (PROMETHAZINE-DM) 6.25-15 MG/5ML syrup; Take 5 mLs by mouth 4 (four) times daily as needed for cough.  Dispense: 118 mL; Refill: 0 - azithromycin (ZITHROMAX) 250 MG tablet; Take 2 tablets on day 1, then 1 tablet daily on days 2 through 5  Dispense: 6 tablet; Refill: 0   General Counseling: Ashlin verbalizes understanding of the findings of today's phone visit and agrees with plan of treatment. I have discussed any further diagnostic evaluation that may be needed or ordered today. We also reviewed her medications today. she has been encouraged to call the office with any questions or concerns  that should arise related to todays visit.  Return if symptoms worsen or fail to improve.   No orders of the defined types were placed in this encounter.   Meds ordered this encounter  Medications   promethazine-dextromethorphan (PROMETHAZINE-DM) 6.25-15 MG/5ML syrup    Sig: Take 5 mLs by mouth 4 (four) times daily as needed for cough.    Dispense:  118 mL    Refill:  0    Fill today   azithromycin (ZITHROMAX) 250 MG tablet    Sig: Take 2 tablets on day 1, then 1 tablet daily on days 2 through 5    Dispense:  6 tablet    Refill:  0    Time spent:20 Minutes Time spent with patient included reviewing progress notes, labs, imaging studies, and discussing plan for follow up.  [place] Controlled Substance Database was reviewed by me for overdose risk score (ORS) if appropriate.  This patient was seen by Sallyanne Kuster, FNP-C in collaboration with Dr. Beverely Risen as a part of collaborative care agreement.  Darya Bigler R. Tedd Sias, MSN, FNP-C Internal medicine

## 2023-11-10 NOTE — Telephone Encounter (Signed)
Work note emailed to patient. Scanned-Toni

## 2023-11-12 NOTE — Progress Notes (Signed)
Will discuss results next week at upcoming visit

## 2023-11-14 ENCOUNTER — Encounter: Payer: Self-pay | Admitting: Nurse Practitioner

## 2023-11-15 ENCOUNTER — Telehealth: Payer: Self-pay

## 2023-11-16 ENCOUNTER — Encounter: Payer: Self-pay | Admitting: Nurse Practitioner

## 2023-11-16 ENCOUNTER — Ambulatory Visit (INDEPENDENT_AMBULATORY_CARE_PROVIDER_SITE_OTHER): Payer: BC Managed Care – PPO | Admitting: Nurse Practitioner

## 2023-11-16 VITALS — BP 136/78 | HR 78 | Temp 96.8°F | Resp 16 | Ht 64.0 in | Wt 153.8 lb

## 2023-11-16 DIAGNOSIS — I700 Atherosclerosis of aorta: Secondary | ICD-10-CM

## 2023-11-16 DIAGNOSIS — Z0001 Encounter for general adult medical examination with abnormal findings: Secondary | ICD-10-CM

## 2023-11-16 DIAGNOSIS — K219 Gastro-esophageal reflux disease without esophagitis: Secondary | ICD-10-CM

## 2023-11-16 DIAGNOSIS — E559 Vitamin D deficiency, unspecified: Secondary | ICD-10-CM

## 2023-11-16 DIAGNOSIS — Z1231 Encounter for screening mammogram for malignant neoplasm of breast: Secondary | ICD-10-CM

## 2023-11-16 DIAGNOSIS — Z1211 Encounter for screening for malignant neoplasm of colon: Secondary | ICD-10-CM

## 2023-11-16 DIAGNOSIS — M48061 Spinal stenosis, lumbar region without neurogenic claudication: Secondary | ICD-10-CM | POA: Insufficient documentation

## 2023-11-16 DIAGNOSIS — Z1212 Encounter for screening for malignant neoplasm of rectum: Secondary | ICD-10-CM

## 2023-11-16 DIAGNOSIS — M48062 Spinal stenosis, lumbar region with neurogenic claudication: Secondary | ICD-10-CM | POA: Diagnosis not present

## 2023-11-16 DIAGNOSIS — I10 Essential (primary) hypertension: Secondary | ICD-10-CM | POA: Diagnosis not present

## 2023-11-16 MED ORDER — OMEPRAZOLE 40 MG PO CPDR: 40.0000 mg | DELAYED_RELEASE_CAPSULE | Freq: Two times a day (BID) | ORAL | 3 refills | Status: AC

## 2023-11-16 MED ORDER — LOVASTATIN 20 MG PO TABS: 20.0000 mg | ORAL_TABLET | Freq: Every day | ORAL | 3 refills | Status: AC

## 2023-11-16 MED ORDER — AMLODIPINE BESYLATE 10 MG PO TABS
10.0000 mg | ORAL_TABLET | Freq: Every day | ORAL | 1 refills | Status: DC
Start: 2023-11-16 — End: 2024-05-08

## 2023-11-16 NOTE — Patient Instructions (Signed)
Start over the counter vitamin D and B12 supplements.

## 2023-11-16 NOTE — Progress Notes (Signed)
[facility] [address] [place], [place] [address]  Internal MEDICINE  Office Visit Note  Patient Name: Amy Ferrell  [id number]  [id number]  Date of Service: 11/16/2023  Chief Complaint  Patient presents with   Depression   Gastroesophageal Reflux   Hypertension   Annual Exam    HPI Amy Ferrell presents for an annual well visit and physical exam.  Well-appearing 57 y.o. female with COPD, fibromyalgia, chronic back pain, SVT, GERD, and depression.  Routine CRC screening: ordered today GI referral, also having issues with GERD and alternating constipation and diarrhea Routine mammogram: due in January.  Labs: lab results discussed with patient today New or worsening pain: chronic back pain and fibromyalgia Other concerns: High Wbc, eval by hematology previously, nothing found. Neutrophils high  Lumbar spine CT scan reviewed -- shows lumbar spinal stenosis, recess stenosis and foraminal stenosis. New multilevel bulging discs.   Current Medication: Outpatient Encounter Medications as of 11/16/2023  Medication Sig   albuterol (PROVENTIL) (2.5 MG/3ML) 0.083% nebulizer solution Take 3 mLs (2.5 mg total) by nebulization every 4 (four) hours as needed for wheezing or shortness of breath.   albuterol (PROVENTIL) (2.5 MG/3ML) 0.083% nebulizer solution Take 3 mLs (2.5 mg total) by nebulization every 4 (four) hours as needed for wheezing or shortness of breath.   albuterol (VENTOLIN HFA) 108 (90 Base) MCG/ACT inhaler INHALE 2 PUFFS BY MOUTH EVERY 6 HOURS AS NEEDED   Budeson-Glycopyrrol-Formoterol (BREZTRI AEROSPHERE) 160-9-4.8 MCG/ACT AERO Inhale 2 puffs into the lungs 2 (two) times daily.   HYDROcodone-homatropine (HYCODAN) 5-1.5 MG/5ML syrup Take 5 mLs by mouth every 6 (six) hours as needed for cough.   LORazepam (ATIVAN) 1 MG tablet Take 1 tablet (1 mg total) by mouth every 8 (eight) hours as needed for anxiety.   lovastatin (MEVACOR) 20 MG tablet Take 1 tablet (20 mg  total) by mouth at bedtime.   mupirocin ointment (BACTROBAN) 2 % Apply 1 Application topically daily. To affected area of inner thighs until healed   predniSONE (DELTASONE) 10 MG tablet Use as directed for 6 days taper   predniSONE (STERAPRED UNI-PAK 21 TAB) 10 MG (21) TBPK tablet Take 4 tablets (40 mg total) by mouth daily for 5 days, THEN 3 tablets (30 mg total) daily for 2 days, THEN 2 tablets (20 mg total) daily for 2 days, THEN 1 tablet (10 mg total) daily for 2 days.   promethazine-dextromethorphan (PROMETHAZINE-DM) 6.25-15 MG/5ML syrup Take 5 mLs by mouth 4 (four) times daily as needed for cough.   traMADol (ULTRAM) 50 MG tablet Take 2 tablets (100 mg total) by mouth every 8 (eight) hours as needed for severe pain (pain score 7-10).   [DISCONTINUED] amLODipine (NORVASC) 10 MG tablet Take 10 mg by mouth daily.   [DISCONTINUED] nitrofurantoin, macrocrystal-monohydrate, (MACROBID) 100 MG capsule Take 1 capsule (100 mg total) by mouth 2 (two) times daily.   [DISCONTINUED] omeprazole (PRILOSEC) 40 MG capsule Take 40 mg by mouth 2 (two) times daily.     amLODipine (NORVASC) 10 MG tablet Take 1 tablet (10 mg total) by mouth daily.   omeprazole (PRILOSEC) 40 MG capsule Take 1 capsule (40 mg total) by mouth 2 (two) times daily.   No facility-administered encounter medications on file as of 11/16/2023.    Surgical History: Past Surgical History:  Procedure Laterality Date   ABDOMINAL HYSTERECTOMY     appebdectomy     APPENDECTOMY     CESAREAN SECTION     x3    CHOLECYSTECTOMY  LAPAROSCOPIC SALPINGO OOPHERECTOMY Right 10/16/2019   Procedure: LAPAROSCOPIC SALPINGO OOPHORECTOMY;  Surgeon: Linzie Collin, MD;  Location: [facility] ORS;  Service: Gynecology;  Laterality: Right;   VESICOVAGINAL FISTULA CLOSURE W/ TAH      Medical History: Past Medical History:  Diagnosis Date   Complication of anesthesia    2004 spinal did not work    COPD (chronic obstructive pulmonary disease) (HCC)     Depression    Essential hypertension    Family history of adverse reaction to anesthesia    mom slow to wake up   GERD (gastroesophageal reflux disease)    Hypertension    Moderate aortic insufficiency    a. 02/2023 Echo: EF 60-65%, no rwma, nl RV fxn, RVSP . Mild MR. Mod AI.   Nonosbstructive CAD (coronary artery disease)    a. 01/2023 Cor CTA: Ca2+ = 18.6 (85th%'ile). LM nl, LAD <25p, LCX nl, RCA nl.   Ovarian tumor    PSVT (paroxysmal supraventricular tachycardia) (HCC)    a. 01/2023 Zio: Predominantly sinus rhythm with average of 56 (56-123).  Rare PACs and PVCs.  7 brief atrial runs (fastest 19 beats up to 174).  Symptoms noted with 5 SVT runs and PACs.   Renal disorder    conjoined kidney   UTI (urinary tract infection)     Family History: Family History  Problem Relation Age of Onset   Hypertension Mother    Ovarian cancer Mother    Congestive Heart Failure Mother    Aortic aneurysm Maternal Grandmother    Heart attack Maternal Grandmother        Apparently had several.   Coronary artery disease Other        GF   COPD Maternal Aunt        Lymphedema   Breast cancer Neg Hx     Social History   Socioeconomic History   Marital status: Married    Spouse name: Not on file   Number of children: Not on file   Years of education: Not on file   Highest education level: Not on file  Occupational History   Not on file  Tobacco Use   Smoking status: Every Day    Current packs/day: 0.00    Average packs/day: 1 pack/day for 37.0 years (37.0 ttl pk-yrs)    Types: Cigarettes    Start date: 11/17/1983    Last attempt to quit: 11/16/2020    Years since quitting: 3.0   Smokeless tobacco: Never   Tobacco comments:    Smokes over a pack a day.  Vaping Use   Vaping status: Never Used  Substance and Sexual Activity   Alcohol use: Not Currently    Comment: occasional   Drug use: Yes    Types: Marijuana    Comment: occasional   Sexual activity: Yes    Birth  control/protection: Surgical  Other Topics Concern   Not on file  Social History Narrative   Single.    Social Determinants of Health   Financial Resource Strain: Not on file  Food Insecurity: Not on file  Transportation Needs: Not on file  Physical Activity: Not on file  Stress: Not on file  Social Connections: Not on file  Intimate Partner Violence: Not on file      Review of Systems  Constitutional:  Positive for activity change and fatigue. Negative for appetite change, chills, fever and unexpected weight change.  HENT: Negative.  Negative for congestion, ear pain, rhinorrhea, sore throat and trouble swallowing.  Eyes: Negative.   Respiratory:  Positive for cough and shortness of breath. Negative for chest tightness and wheezing.   Cardiovascular: Negative.  Negative for chest pain and palpitations.  Gastrointestinal:  Positive for constipation, diarrhea and nausea. Negative for abdominal pain, blood in stool and vomiting.       Acid reflux  Endocrine: Negative.   Genitourinary: Negative.  Negative for difficulty urinating, dysuria, frequency, hematuria and urgency.  Musculoskeletal:  Positive for arthralgias, back pain, gait problem and myalgias. Negative for joint swelling and neck pain.  Skin: Negative.  Negative for rash and wound.  Allergic/Immunologic: Negative.  Negative for immunocompromised state.  Neurological:  Negative for dizziness, seizures, numbness and headaches.  Hematological: Negative.   Psychiatric/Behavioral:  Positive for behavioral problems and sleep disturbance. Negative for self-injury and suicidal ideas. The patient is nervous/anxious.     Vital Signs: BP 136/78   Pulse 78   Temp (!) 96.8 F (36 C)   Resp 16   Ht 5\' 4"  (1.626 m)   Wt 153 lb 12.8 oz (69.8 kg)   SpO2 97%   BMI 26.40 kg/m    Physical Exam Vitals reviewed.  Constitutional:      General: She is not in acute distress.    Appearance: Normal appearance. She is  well-developed. She is not ill-appearing or diaphoretic.  HENT:     Head: Normocephalic and atraumatic.     Right Ear: Tympanic membrane, ear canal and external ear normal. There is no impacted cerumen.     Left Ear: Tympanic membrane, ear canal and external ear normal. There is no impacted cerumen.     Nose: Nose normal. No congestion or rhinorrhea.     Mouth/Throat:     Mouth: Mucous membranes are moist.     Pharynx: Oropharynx is clear. No oropharyngeal exudate or posterior oropharyngeal erythema.  Eyes:     General: No scleral icterus.       Right eye: No discharge.        Left eye: No discharge.     Extraocular Movements: Extraocular movements intact.     Conjunctiva/sclera: Conjunctivae normal.     Pupils: Pupils are equal, round, and reactive to light.  Neck:     Thyroid: No thyromegaly.     Vascular: No JVD.     Trachea: No tracheal deviation.  Cardiovascular:     Rate and Rhythm: Normal rate and regular rhythm.     Pulses: Normal pulses.     Heart sounds: Normal heart sounds. No murmur heard.    No friction rub. No gallop.  Pulmonary:     Effort: Pulmonary effort is normal. No respiratory distress.     Breath sounds: Normal breath sounds. No stridor. No wheezing or rales.  Chest:     Chest wall: No tenderness.  Abdominal:     General: Bowel sounds are normal. There is no distension.     Palpations: Abdomen is soft. There is no mass.     Tenderness: There is no abdominal tenderness. There is no guarding or rebound.  Musculoskeletal:        General: No tenderness or deformity. Normal range of motion.     Cervical back: Normal range of motion and neck supple.  Lymphadenopathy:     Cervical: No cervical adenopathy.  Skin:    General: Skin is warm and dry.     Capillary Refill: Capillary refill takes less than 2 seconds.     Coloration: Skin is not pale.  Findings: No erythema or rash.  Neurological:     Mental Status: She is alert and oriented to person, place,  and time.     Cranial Nerves: No cranial nerve deficit.     Motor: No abnormal muscle tone.     Coordination: Coordination normal.     Gait: Gait normal.     Deep Tendon Reflexes: Reflexes are normal and symmetric.  Psychiatric:        Mood and Affect: Mood normal.        Behavior: Behavior normal.        Thought Content: Thought content normal.        Judgment: Judgment normal.        Assessment/Plan: 1. Encounter for routine adult health examination with abnormal findings ***  2. Spinal stenosis of lumbar region with neurogenic claudication *** - Ambulatory referral to Neurosurgery  3. Bilateral stenosis of lateral recess of lumbar spine *** - Ambulatory referral to Neurosurgery  4. Aortic atherosclerosis (HCC) *** - lovastatin (MEVACOR) 20 MG tablet; Take 1 tablet (20 mg total) by mouth at bedtime.  Dispense: 90 tablet; Refill: 3 - US Carotid Duplex Bilateral; Future  5. Primary hypertension *** - amLODipine (NORVASC) 10 MG tablet; Take 1 tablet (10 mg total) by mouth daily.  Dispense: 90 tablet; Refill: 1  6. Gastroesophageal reflux disease without esophagitis *** - Ambulatory referral to Gastroenterology - omeprazole (PRILOSEC) 40 MG capsule; Take 1 capsule (40 mg total) by mouth 2 (two) times daily.  Dispense: 180 capsule; Refill: 3  7. Vitamin D deficiency ***  8. Screening for colorectal cancer *** - Ambulatory referral to Gastroenterology  9. Encounter for screening mammogram for malignant neoplasm of breast *** - MM 3D SCREENING MAMMOGRAM BILATERAL BREAST; Future     General Counseling: Kamya verbalizes understanding of the findings of todays visit and agrees with plan of treatment. I have discussed any further diagnostic evaluation that may be needed or ordered today. We also reviewed her medications today. she has been encouraged to call the office with any questions or concerns that should arise related to todays visit.    Orders Placed  This Encounter  Procedures   US Carotid Duplex Bilateral   MM 3D SCREENING MAMMOGRAM BILATERAL BREAST   Ambulatory referral to Neurosurgery   Ambulatory referral to Gastroenterology    Meds ordered this encounter  Medications   lovastatin (MEVACOR) 20 MG tablet    Sig: Take 1 tablet (20 mg total) by mouth at bedtime.    Dispense:  90 tablet    Refill:  3    Fill new script today   omeprazole (PRILOSEC) 40 MG capsule    Sig: Take 1 capsule (40 mg total) by mouth 2 (two) times daily.    Dispense:  180 capsule    Refill:  3   amLODipine (NORVASC) 10 MG tablet    Sig: Take 1 tablet (10 mg total) by mouth daily.    Dispense:  90 tablet    Refill:  1    Return in about 1 month (around 12/16/2023) for F/U, Fidencia Mccloud PCP -- new med and carotid ultrasound results. .   Total time spent:30 Minutes Time spent includes review of chart, medications, test results, and follow up plan with the patient.   [place] Controlled Substance Database was reviewed by me.  This patient was seen by Sallyanne Kuster, FNP-C in collaboration with Dr. Beverely Risen as a part of collaborative care agreement.  Dilan Fullenwider R. Tedd Sias, MSN, FNP-C Internal  medicine

## 2023-11-16 NOTE — Telephone Encounter (Signed)
Patient notified

## 2023-12-02 ENCOUNTER — Other Ambulatory Visit: Payer: BC Managed Care – PPO

## 2023-12-17 ENCOUNTER — Other Ambulatory Visit: Payer: Self-pay | Admitting: Nurse Practitioner

## 2023-12-17 NOTE — Telephone Encounter (Signed)
Please review

## 2023-12-27 ENCOUNTER — Ambulatory Visit (INDEPENDENT_AMBULATORY_CARE_PROVIDER_SITE_OTHER): Payer: BC Managed Care – PPO | Admitting: Nurse Practitioner

## 2023-12-27 ENCOUNTER — Encounter: Payer: Self-pay | Admitting: Nurse Practitioner

## 2023-12-27 VITALS — BP 136/78 | HR 84 | Temp 98.3°F | Resp 16 | Ht 64.0 in | Wt 156.2 lb

## 2023-12-27 DIAGNOSIS — M48061 Spinal stenosis, lumbar region without neurogenic claudication: Secondary | ICD-10-CM

## 2023-12-27 DIAGNOSIS — M48062 Spinal stenosis, lumbar region with neurogenic claudication: Secondary | ICD-10-CM

## 2023-12-27 DIAGNOSIS — I700 Atherosclerosis of aorta: Secondary | ICD-10-CM | POA: Diagnosis not present

## 2023-12-27 DIAGNOSIS — I10 Essential (primary) hypertension: Secondary | ICD-10-CM

## 2023-12-27 DIAGNOSIS — J4489 Other specified chronic obstructive pulmonary disease: Secondary | ICD-10-CM | POA: Diagnosis not present

## 2023-12-27 MED ORDER — TRAMADOL HCL ER 100 MG PO TB24: 100.0000 mg | ORAL_TABLET | Freq: Every day | ORAL | 1 refills | Status: DC | PRN

## 2023-12-27 NOTE — Progress Notes (Signed)
 [facility] [address] [place], [place] [address]  Internal MEDICINE  Office Visit Note  Patient Name: Amy Ferrell  [id number]  [id number]  Date of Service: 12/27/2023  Chief Complaint  Patient presents with   Depression   Gastroesophageal Reflux   Hypertension   Follow-up    Eval new med     HPI Amy Ferrell presents for a follow-up visit for COPD, spinal stenosis, hypertension and high cholesterol.  COPD -- doing better with breztri  inhaler twice daily.  Spinal stenosis and back pain -- taking tramadol  as needed Hypertension -- controlled with amlodipine  Atherosclerosis and high cholesterol -- on lovastatin , no issues.     Current Medication: Outpatient Encounter Medications as of 12/27/2023  Medication Sig   traMADol  (ULTRAM -ER) 100 MG 24 hr tablet Take 1 tablet (100 mg total) by mouth daily as needed for pain.   albuterol  (PROVENTIL ) (2.5 MG/3ML) 0.083% nebulizer solution Take 3 mLs (2.5 mg total) by nebulization every 4 (four) hours as needed for wheezing or shortness of breath.   albuterol  (PROVENTIL ) (2.5 MG/3ML) 0.083% nebulizer solution Take 3 mLs (2.5 mg total) by nebulization every 4 (four) hours as needed for wheezing or shortness of breath.   albuterol  (VENTOLIN  HFA) 108 (90 Base) MCG/ACT inhaler INHALE 2 PUFFS BY MOUTH EVERY 6 HOURS AS NEEDED   amLODipine  (NORVASC ) 10 MG tablet Take 1 tablet (10 mg total) by mouth daily.   Budeson-Glycopyrrol-Formoterol (BREZTRI  AEROSPHERE) 160-9-4.8 MCG/ACT AERO Inhale 2 puffs into the lungs 2 (two) times daily.   HYDROcodone -homatropine (HYCODAN) 5-1.5 MG/5ML syrup Take 5 mLs by mouth every 6 (six) hours as needed for cough.   LORazepam  (ATIVAN ) 1 MG tablet Take 1 tablet (1 mg total) by mouth every 8 (eight) hours as needed for anxiety.   lovastatin  (MEVACOR ) 20 MG tablet Take 1 tablet (20 mg total) by mouth at bedtime.   mupirocin  ointment (BACTROBAN ) 2 % Apply 1 Application topically daily. To affected area of  inner thighs until healed   omeprazole  (PRILOSEC) 40 MG capsule Take 1 capsule (40 mg total) by mouth 2 (two) times daily.   predniSONE  (DELTASONE ) 10 MG tablet Use as directed for 6 days taper   promethazine -dextromethorphan (PROMETHAZINE -DM) 6.25-15 MG/5ML syrup Take 5 mLs by mouth 4 (four) times daily as needed for cough.   [DISCONTINUED] traMADol  (ULTRAM ) 50 MG tablet TAKE 1 TABLET BY MOUTH EVERY 6 HOURS AS NEEDED FOR SEVERE PAIN   No facility-administered encounter medications on file as of 12/27/2023.    Surgical History: Past Surgical History:  Procedure Laterality Date   ABDOMINAL HYSTERECTOMY     appebdectomy     APPENDECTOMY     CESAREAN SECTION     x3    CHOLECYSTECTOMY     LAPAROSCOPIC SALPINGO OOPHERECTOMY Right 10/16/2019   Procedure: LAPAROSCOPIC SALPINGO OOPHORECTOMY;  Surgeon: Janit Alm Agent, MD;  Location: [facility] ORS;  Service: Gynecology;  Laterality: Right;   VESICOVAGINAL FISTULA CLOSURE W/ TAH      Medical History: Past Medical History:  Diagnosis Date   Complication of anesthesia    2004 spinal did not work    COPD (chronic obstructive pulmonary disease) (HCC)    Depression    Essential hypertension    Family history of adverse reaction to anesthesia    mom slow to wake up   GERD (gastroesophageal reflux disease)    Hypertension    Moderate aortic insufficiency    a. 02/2023 Echo: EF 60-65%, no rwma, nl RV fxn, RVSP . Mild  MR. Mod AI.   Nonosbstructive CAD (coronary artery disease)    a. 01/2023 Cor CTA: Ca2+ = 18.6 (85th%'ile). LM nl, LAD <25p, LCX nl, RCA nl.   Ovarian tumor    PSVT (paroxysmal supraventricular tachycardia) (HCC)    a. 01/2023 Zio: Predominantly sinus rhythm with average of 56 (56-123).  Rare PACs and PVCs.  7 brief atrial runs (fastest 19 beats up to 174).  Symptoms noted with 5 SVT runs and PACs.   Renal disorder    conjoined kidney   UTI (urinary tract infection)     Family History: Family History  Problem Relation Age  of Onset   Hypertension Mother    Ovarian cancer Mother    Congestive Heart Failure Mother    Aortic aneurysm Maternal Grandmother    Heart attack Maternal Grandmother        Apparently had several.   Coronary artery disease Other        GF   COPD Maternal Aunt        Lymphedema   Breast cancer Neg Hx     Social History   Socioeconomic History   Marital status: Married    Spouse name: Not on file   Number of children: Not on file   Years of education: Not on file   Highest education level: Not on file  Occupational History   Not on file  Tobacco Use   Smoking status: Every Day    Current packs/day: 0.00    Average packs/day: 1 pack/day for 37.0 years (37.0 ttl pk-yrs)    Types: Cigarettes    Start date: 11/17/1983    Last attempt to quit: 11/16/2020    Years since quitting: 3.1   Smokeless tobacco: Never   Tobacco comments:    Smokes over a pack a day.  Vaping Use   Vaping status: Never Used  Substance and Sexual Activity   Alcohol use: Not Currently    Comment: occasional   Drug use: Yes    Types: Marijuana    Comment: occasional   Sexual activity: Yes    Birth control/protection: Surgical  Other Topics Concern   Not on file  Social History Narrative   Single.    Social Drivers of Corporate Investment Banker Strain: Not on file  Food Insecurity: Not on file  Transportation Needs: Not on file  Physical Activity: Not on file  Stress: Not on file  Social Connections: Not on file  Intimate Partner Violence: Not on file      Review of Systems  Constitutional:  Negative for chills, fatigue and unexpected weight change.  HENT:  Negative for congestion, postnasal drip, rhinorrhea, sneezing and sore throat.   Eyes:  Negative for redness.  Respiratory:  Positive for cough and shortness of breath. Negative for chest tightness and wheezing.   Cardiovascular: Negative.  Negative for chest pain and palpitations.  Gastrointestinal:  Negative for abdominal pain,  constipation, diarrhea, nausea and vomiting.  Genitourinary:  Negative for dysuria and frequency.  Musculoskeletal:  Positive for arthralgias and back pain. Negative for joint swelling and neck pain.  Skin:  Negative for rash.  Neurological: Negative.  Negative for tremors and numbness.  Hematological:  Negative for adenopathy. Does not bruise/bleed easily.  Psychiatric/Behavioral:  Positive for behavioral problems (Depression) and sleep disturbance. Negative for self-injury and suicidal ideas. The patient is nervous/anxious.     Vital Signs: BP 136/78   Pulse 84   Temp 98.3 F (36.8 C)   Resp  16   Ht 5' 4 (1.626 m)   Wt 156 lb 3.2 oz (70.9 kg)   SpO2 94%   BMI 26.81 kg/m    Physical Exam Vitals reviewed.  Constitutional:      Appearance: Normal appearance.  HENT:     Head: Normocephalic and atraumatic.  Eyes:     Pupils: Pupils are equal, round, and reactive to light.  Cardiovascular:     Rate and Rhythm: Normal rate and regular rhythm.  Skin:    Capillary Refill: Capillary refill takes less than 2 seconds.  Neurological:     Mental Status: She is alert and oriented to person, place, and time.  Psychiatric:        Mood and Affect: Mood normal.        Behavior: Behavior normal.        Assessment/Plan: 1. Obstructive chronic bronchitis without exacerbation (HCC) (Primary) Continue breztri  inhaler as prescribed   2. Primary hypertension Stable, Continue amlodipine  as prescribed   3. Aortic atherosclerosis (HCC) Continue lovastatin  as prescribed.   4. Spinal stenosis of lumbar region with neurogenic claudication Continue tramadol  as prescribed.  - traMADol  (ULTRAM -ER) 100 MG 24 hr tablet; Take 1 tablet (100 mg total) by mouth daily as needed for pain.  Dispense: 30 tablet; Refill: 1  5. Bilateral stenosis of lateral recess of lumbar spine Continue tramadol  as prescribed.  - traMADol  (ULTRAM -ER) 100 MG 24 hr tablet; Take 1 tablet (100 mg total) by mouth daily  as needed for pain.  Dispense: 30 tablet; Refill: 1   General Counseling: Amy Ferrell verbalizes understanding of the findings of todays visit and agrees with plan of treatment. I have discussed any further diagnostic evaluation that may be needed or ordered today. We also reviewed her medications today. she has been encouraged to call the office with any questions or concerns that should arise related to todays visit.    No orders of the defined types were placed in this encounter.   Meds ordered this encounter  Medications   traMADol  (ULTRAM -ER) 100 MG 24 hr tablet    Sig: Take 1 tablet (100 mg total) by mouth daily as needed for pain.    Dispense:  30 tablet    Refill:  1    Switching from immediate release to extended release, not an initial prescription . Discontinue prior orders for tramadol , fill this script today.    Return in about 4 weeks (around 01/24/2024) for F/U, Maybelle Depaoli PCP, eval new med .   Total time spent:30 Minutes Time spent includes review of chart, medications, test results, and follow up plan with the patient.   [place] Controlled Substance Database was reviewed by me.  This patient was seen by Mardy Maxin, FNP-C in collaboration with Dr. Sigrid Bathe as a part of collaborative care agreement.   Adisynn Suleiman R. Maxin, MSN, FNP-C Internal medicine

## 2023-12-29 ENCOUNTER — Encounter: Payer: Self-pay | Admitting: Nurse Practitioner

## 2024-01-03 ENCOUNTER — Ambulatory Visit: Payer: BC Managed Care – PPO | Admitting: Physician Assistant

## 2024-01-05 ENCOUNTER — Other Ambulatory Visit: Payer: BC Managed Care – PPO

## 2024-01-10 ENCOUNTER — Other Ambulatory Visit: Payer: Self-pay | Admitting: Nurse Practitioner

## 2024-01-24 ENCOUNTER — Other Ambulatory Visit: Payer: Self-pay

## 2024-01-25 NOTE — Progress Notes (Deleted)
 Ellouise Console, PA-C [address]  [address]  [place], [place] [address]  Main: [phone]  Fax: [phone]   Gastroenterology Consultation  Referring Provider:     Liana Fish, NP Primary Care Physician:  Liana Fish, NP Primary Gastroenterologist:  *** Reason for Consultation:     GERD, colon cancer screening        HPI:   Amy Ferrell is a 58 y.o. y/o female referred for consultation & management  by Liana Fish, NP.  Referred for GERD and colon cancer screening.  Currently taking omeprazole  40 Mg twice daily.  Prior cholecystectomy.    No previous EGD, colonoscopy, or GI evaluation.  History of COPD, smoker, PSVT, CAD, hypertension.  Past Medical History:  Diagnosis Date   Complication of anesthesia    2004 spinal did not work    COPD (chronic obstructive pulmonary disease) (HCC)    Depression    Essential hypertension    Family history of adverse reaction to anesthesia    mom slow to wake up   GERD (gastroesophageal reflux disease)    Hypertension    Moderate aortic insufficiency    a. 02/2023 Echo: EF 60-65%, no rwma, nl RV fxn, RVSP . Mild MR. Mod AI.   Nonosbstructive CAD (coronary artery disease)    a. 01/2023 Cor CTA: Ca2+ = 18.6 (85th%'ile). LM nl, LAD <25p, LCX nl, RCA nl.   Ovarian tumor    PSVT (paroxysmal supraventricular tachycardia) (HCC)    a. 01/2023 Zio: Predominantly sinus rhythm with average of 56 (56-123).  Rare PACs and PVCs.  7 brief atrial runs (fastest 19 beats up to 174).  Symptoms noted with 5 SVT runs and PACs.   Renal disorder    conjoined kidney   UTI (urinary tract infection)     Past Surgical History:  Procedure Laterality Date   ABDOMINAL HYSTERECTOMY     appebdectomy     APPENDECTOMY     CESAREAN SECTION     x3    CHOLECYSTECTOMY     LAPAROSCOPIC SALPINGO OOPHERECTOMY Right 10/16/2019   Procedure: LAPAROSCOPIC SALPINGO OOPHORECTOMY;  Surgeon: Janit Alm Agent, MD;  Location: [facility] ORS;   Service: Gynecology;  Laterality: Right;   VESICOVAGINAL FISTULA CLOSURE W/ TAH      Prior to Admission medications   Medication Sig Start Date End Date Taking? Authorizing Provider  albuterol  (PROVENTIL ) (2.5 MG/3ML) 0.083% nebulizer solution Take 3 mLs (2.5 mg total) by nebulization every 4 (four) hours as needed for wheezing or shortness of breath. 11/09/23 11/08/24  Claudene Rover, MD  albuterol  (VENTOLIN  HFA) 108 [phone] Base) MCG/ACT inhaler INHALE 2 PUFFS BY MOUTH EVERY 6 HOURS AS NEEDED 01/10/24   Liana Fish, NP  amLODipine  (NORVASC ) 10 MG tablet Take 1 tablet (10 mg total) by mouth daily. 11/16/23   Liana Fish, NP  Budeson-Glycopyrrol-Formoterol (BREZTRI  AEROSPHERE) 160-9-4.8 MCG/ACT AERO Inhale 2 puffs into the lungs 2 (two) times daily. 06/29/23   Fernand Elfreda LABOR, MD  HYDROcodone -homatropine (HYCODAN) 5-1.5 MG/5ML syrup Take 5 mLs by mouth every 6 (six) hours as needed for cough. 12/06/19   Angelena Smalls, MD  LORazepam  (ATIVAN ) 1 MG tablet Take 1 tablet (1 mg total) by mouth every 8 (eight) hours as needed for anxiety. 01/19/23   Sung, Jade J, MD  lovastatin  (MEVACOR ) 20 MG tablet Take 1 tablet (20 mg total) by mouth at bedtime. 11/16/23   Abernathy, Alyssa, NP  mupirocin  ointment (BACTROBAN ) 2 % Apply 1 Application topically daily. To affected area  of inner thighs until healed 09/28/23   Liana Fish, NP  omeprazole  (PRILOSEC) 40 MG capsule Take 1 capsule (40 mg total) by mouth 2 (two) times daily. 11/16/23   Liana Fish, NP  predniSONE  (DELTASONE ) 10 MG tablet Use as directed for 6 days taper 11/02/23   Khan, Fozia M, MD  promethazine -dextromethorphan (PROMETHAZINE -DM) 6.25-15 MG/5ML syrup Take 5 mLs by mouth 4 (four) times daily as needed for cough. 11/10/23   Liana Fish, NP  traMADol  (ULTRAM -ER) 100 MG 24 hr tablet Take 1 tablet (100 mg total) by mouth daily as needed for pain. 12/27/23   Liana Fish, NP    Family History  Problem Relation Age of Onset    Hypertension Mother    Ovarian cancer Mother    Congestive Heart Failure Mother    Aortic aneurysm Maternal Grandmother    Heart attack Maternal Grandmother        Apparently had several.   Coronary artery disease Other        GF   COPD Maternal Aunt        Lymphedema   Breast cancer Neg Hx      Social History   Tobacco Use   Smoking status: Every Day    Current packs/day: 0.00    Average packs/day: 1 pack/day for 37.0 years (37.0 ttl pk-yrs)    Types: Cigarettes    Start date: 11/17/1983    Last attempt to quit: 11/16/2020    Years since quitting: 3.1   Smokeless tobacco: Never   Tobacco comments:    Smokes over a pack a day.  Vaping Use   Vaping status: Never Used  Substance Use Topics   Alcohol use: Not Currently    Comment: occasional   Drug use: Yes    Types: Marijuana    Comment: occasional    Allergies as of 01/26/2024 - Review Complete 12/29/2023  Allergen Reaction Noted   Adhesive [tape] Rash 10/03/2019    Review of Systems:    All systems reviewed and negative except where noted in HPI.   Physical Exam:  There were no vitals taken for this visit. No LMP recorded. Patient has had a hysterectomy.  General:   Alert,  Well-developed, well-nourished, pleasant and cooperative in NAD Lungs:  Respirations even and unlabored.  Clear throughout to auscultation.   No wheezes, crackles, or rhonchi. No acute distress. Heart:  Regular rate and rhythm; no murmurs, clicks, rubs, or gallops. Abdomen:  Normal bowel sounds.  No bruits.  Soft, and non-distended without masses, hepatosplenomegaly or hernias noted.  No Tenderness.  No guarding or rebound tenderness.    Neurologic:  Alert and oriented x3;  grossly normal neurologically. Psych:  Alert and cooperative. Normal mood and affect.  Imaging Studies: No results found.  Assessment and Plan:   Amy Ferrell is a 58 y.o. y/o female has been referred for   1.  GERD -not controlled on high-dose PPI omeprazole  40  Mg twice daily.  Scheduling EGD I discussed risks of EGD with patient to include risk of bleeding, perforation, and risk of sedation.  Patient expressed understanding and agrees to proceed with EGD.   Recommend Lifestyle Modifications to prevent Acid Reflux.  Rec. Avoid coffee, sodas, peppermint, garlic, onions, alcohol, citrus fruits, chocolate, tomatoes, fatty and spicey foods.  Avoid eating 2-3 hours before bedtime.    2.  Colon cancer screening  Scheduling Colonoscopy I discussed risks of colonoscopy with patient to include risk of bleeding, colon perforation, and risk of  sedation.  Patient expressed understanding and agrees to proceed with colonoscopy.   Follow up ***  Ellouise Console, PA-C    BP check ***

## 2024-01-26 ENCOUNTER — Ambulatory Visit: Payer: BC Managed Care – PPO | Admitting: Physician Assistant

## 2024-01-26 ENCOUNTER — Ambulatory Visit
Admission: RE | Admit: 2024-01-26 | Discharge: 2024-01-26 | Disposition: A | Discharge status: Home / Self Care | Payer: BC Managed Care – PPO | Source: Ambulatory Visit | Attending: Nurse Practitioner

## 2024-01-26 DIAGNOSIS — I700 Atherosclerosis of aorta: Secondary | ICD-10-CM

## 2024-01-26 DIAGNOSIS — I6523 Occlusion and stenosis of bilateral carotid arteries: Secondary | ICD-10-CM | POA: Diagnosis not present

## 2024-01-31 NOTE — Progress Notes (Addendum)
 Referring Physician:  Sallyanne Kuster, NP [address] [place],  [place] [address]  Primary Physician:  Sallyanne Kuster, NP  History of Present Illness: 02/02/2024 Amy Ferrell with the past medical history of fibromyalgia is here today with a chief complaint of low back and buttock pain.  This has been ongoing for over 10 years, but she feels as though its become progressively worse over the past 6 years.  Her pain increases when changing positions, specifically from sitting to standing.  It does not radiate down her legs and mostly extends from her low back to her buttock.  She states it hurts her all the time and she finds herself having to sit crisscross style in order to help.  She does have some numbness and tingling in her buttock, but states that it is not constant.  She says that she is in tears often due to her pain.  She denies any falls, weakness, saddle anesthesia.  She has been largely taking Tylenol and ibuprofen for her pain.  She did undergo physical therapy several years ago as well as injections.  It feels worse when she leans forward.   Conservative measures:  Physical therapy:  has not participated in PT Multimodal medical therapy including regular antiinflammatories:  Hydrocodone, Prednisone, Tramadol Injections: no epidural steroid injections  Past Surgery: none  [name] has no symptoms of cervical myelopathy.  The symptoms are causing a significant impact on the patient's life.   Review of Systems:  A 10 point review of systems is negative, except for the pertinent positives and negatives detailed in the HPI.  Past Medical History: Past Medical History:  Diagnosis Date   Complication of anesthesia    2004 spinal did not work    COPD (chronic obstructive pulmonary disease) (HCC)    Depression    Essential hypertension    Family history of adverse reaction to anesthesia    mom slow to wake up   GERD (gastroesophageal reflux disease)     Hypertension    Moderate aortic insufficiency    a. 02/2023 Echo: EF 60-65%, no rwma, nl RV fxn, RVSP . Mild MR. Mod AI.   Nonosbstructive CAD (coronary artery disease)    a. 01/2023 Cor CTA: Ca2+ = 18.6 (85th%'ile). LM nl, LAD <25p, LCX nl, RCA nl.   Ovarian tumor    PSVT (paroxysmal supraventricular tachycardia) (HCC)    a. 01/2023 Zio: Predominantly sinus rhythm with average of 56 (56-123).  Rare PACs and PVCs.  7 brief atrial runs (fastest 19 beats up to 174).  Symptoms noted with 5 SVT runs and PACs.   Renal disorder    conjoined kidney   UTI (urinary tract infection)     Past Surgical History: Past Surgical History:  Procedure Laterality Date   ABDOMINAL HYSTERECTOMY     appebdectomy     APPENDECTOMY     CESAREAN SECTION     x3    CHOLECYSTECTOMY     LAPAROSCOPIC SALPINGO OOPHERECTOMY Right 10/16/2019   Procedure: LAPAROSCOPIC SALPINGO OOPHORECTOMY;  Surgeon: Amy Collin, MD;  Location: [facility] ORS;  Service: Gynecology;  Laterality: Right;   VESICOVAGINAL FISTULA CLOSURE W/ TAH      Allergies: Allergies as of 02/02/2024 - Review Complete 02/02/2024  Allergen Reaction Noted   Adhesive [tape] Rash 10/03/2019    Medications: Outpatient Encounter Medications as of 02/02/2024  Medication Sig   albuterol (PROVENTIL) (2.5 MG/3ML) 0.083% nebulizer solution Take 3 mLs (2.5 mg total) by nebulization every 4 (  four) hours as needed for wheezing or shortness of breath.   albuterol (VENTOLIN HFA) 108 (90 Base) MCG/ACT inhaler INHALE 2 PUFFS BY MOUTH EVERY 6 HOURS AS NEEDED   amLODipine (NORVASC) 10 MG tablet Take 1 tablet (10 mg total) by mouth daily.   Budeson-Glycopyrrol-Formoterol (BREZTRI AEROSPHERE) 160-9-4.8 MCG/ACT AERO Inhale 2 puffs into the lungs 2 (two) times daily.   HYDROcodone-homatropine (HYCODAN) 5-1.5 MG/5ML syrup Take 5 mLs by mouth every 6 (six) hours as needed for cough.   LORazepam (ATIVAN) 1 MG tablet Take 1 tablet (1 mg total) by mouth every 8 (eight)  hours as needed for anxiety.   lovastatin (MEVACOR) 20 MG tablet Take 1 tablet (20 mg total) by mouth at bedtime.   mupirocin ointment (BACTROBAN) 2 % Apply 1 Application topically daily. To affected area of inner thighs until healed   omeprazole (PRILOSEC) 40 MG capsule Take 1 capsule (40 mg total) by mouth 2 (two) times daily.   predniSONE (DELTASONE) 10 MG tablet Use as directed for 6 days taper   promethazine-dextromethorphan (PROMETHAZINE-DM) 6.25-15 MG/5ML syrup Take 5 mLs by mouth 4 (four) times daily as needed for cough.   traMADol (ULTRAM-ER) 100 MG 24 hr tablet Take 1 tablet (100 mg total) by mouth daily as needed for pain.   No facility-administered encounter medications on file as of 02/02/2024.    Social History: Social History   Tobacco Use   Smoking status: Every Day    Current packs/day: 0.00    Average packs/day: 1 pack/day for 37.0 years (37.0 ttl pk-yrs)    Types: Cigarettes    Start date: 11/17/1983    Last attempt to quit: 11/16/2020    Years since quitting: 3.2   Smokeless tobacco: Never   Tobacco comments:    Smokes over a pack a day.  Vaping Use   Vaping status: Never Used  Substance Use Topics   Alcohol use: Not Currently    Comment: occasional   Drug use: Yes    Types: Marijuana    Comment: occasional    Family Medical History: Family History  Problem Relation Age of Onset   Hypertension Mother    Ovarian cancer Mother    Congestive Heart Failure Mother    Aortic aneurysm Maternal Grandmother    Heart attack Maternal Grandmother        Apparently had several.   Coronary artery disease Other        GF   COPD Maternal Aunt        Lymphedema   Breast cancer Neg Hx     Physical Examination: @VITALWITHPAIN @  General: Patient is well developed, well nourished, calm, collected, and in no apparent distress. Attention to examination is appropriate.  Psychiatric: Patient is non-anxious.  Head:  Pupils equal, round, and reactive to  light.  ENT:  Oral mucosa appears well hydrated.  Neck:   Supple.  Full range of motion.  Respiratory: Patient is breathing without any difficulty.  Extremities: No edema.  Vascular: Palpable dorsal pedal pulses.  Skin:   On exposed skin, there are no abnormal skin lesions.  NEUROLOGICAL:     Awake, alert, oriented to person, place, and time.  Speech is clear and fluent. Fund of knowledge is appropriate.   Cranial Nerves: Pupils equal round and reactive to light.  Facial tone is symmetric.   ROM of spine: Patient with tenderness over lumbar paraspinals.  Pain with internal rotation of bilateral hips.  Strength:  Side Iliopsoas Quads Hamstring PF DF EHL  R 5 5 5 5 5 5   L 5 5 5 5 5 5     Clonus is not present.  Toes are down-going.  Bilateral upper and lower extremity sensation is intact to light touch.    Gait is normal.   No difficulty with tandem gait.   No evidence of dysmetria noted.  Medical Decision Making  Imaging: CT Lumbar spine (11/12/23):   Alignment: New trace anterolisthesis of L4 on L5.   Vertebrae: No fracture or suspicious osseous lesion.   Paraspinal and other soft tissues: Abdominal aortic atherosclerosis without aneurysm. Cholecystectomy. Nonobstructing calculus in the upper pole of the left kidney.   Disc levels:   T12-L1 and L1-2: Negative.   L2-3: New mild disc bulging without evidence of significant stenosis.   L3-4: New mild disc bulging without evidence of significant stenosis.   L4-5: Increased disc bulging, mild ligamentum flavum hypertrophy, and severe right and mild-to-moderate left facet arthrosis result in new mild spinal stenosis, right greater than left lateral recess stenosis, and mild bilateral neural foraminal stenosis.   L5-S1: Mild disc bulging, stable to slightly increased. Increased, moderate to severe left facet arthrosis. No evidence of significant stenosis.   IMPRESSION: 1. Increased lumbar disc and facet  degeneration, most notable at L4-5 where there is mild spinal and neural foraminal stenosis. 2.  Aortic Atherosclerosis (ICD10-I70.0).  I have personally reviewed the images and agree with the above interpretation.  Assessment and Plan: Ms. Nawabi is a pleasant 58 y.o. female with the past medical history of fibromyalgia is here today with a chief complaint of low back and buttock pain.  This has been ongoing for over 10 years, but she feels as though its become progressively worse over the past 6 years.  Her pain increases when changing positions, specifically from sitting to standing.  It does not radiate down her legs and mostly extends from her low back to her buttock.  She states it hurts her all the time and she finds herself having to sit crisscross style in order to help.  She does have some numbness and tingling in her buttock, but states that it is not constant.  She says that she is in tears often due to her pain.  She denies any falls, weakness, saddle anesthesia.  She has been largely taking Tylenol and ibuprofen for her pain.  She did undergo physical therapy several years ago as well as injections.  It feels worse when she leans forward.  On examination, she does have some mild tenderness palpation over lumbar paraspinals.  She has full strength on examination.  No ankle clonus noted.  Her CT scan does show some facet arthritis and some mild foraminal stenosis at L4-5.  It was a pleasure to see the patient in clinic today.  Luckily, patient does not have any radicular symptoms down her legs or associated numbness and tingling there.  Concerned that this could be an aggravation of her SI joint or hips in conjunction with her fibromyalgia since it is more generalized nondermatomal pain..  Would like further imaging to evaluate for this.  Discussed meloxicam for the time being and she was counseled on risks and medications to avoid while taking this.  Would consider physical therapy in the  future.  Will review imaging once complete.  Thank you for involving me in the care of this patient.   Joan Flores, PA-C Dept. of Neurosurgery

## 2024-02-02 ENCOUNTER — Ambulatory Visit: Payer: BC Managed Care – PPO | Admitting: Physician Assistant

## 2024-02-02 ENCOUNTER — Ambulatory Visit
Admission: RE | Admit: 2024-02-02 | Discharge: 2024-02-02 | Disposition: A | Discharge status: Home / Self Care | Payer: BC Managed Care – PPO | Attending: Physician Assistant | Admitting: Physician Assistant

## 2024-02-02 ENCOUNTER — Ambulatory Visit
Admission: RE | Admit: 2024-02-02 | Discharge: 2024-02-02 | Disposition: A | Discharge status: Home / Self Care | Payer: BC Managed Care – PPO | Source: Ambulatory Visit | Attending: Physician Assistant | Admitting: Physician Assistant

## 2024-02-02 ENCOUNTER — Encounter: Payer: Self-pay | Admitting: Physician Assistant

## 2024-02-02 VITALS — BP 160/88 | Ht 64.0 in | Wt 157.8 lb

## 2024-02-02 DIAGNOSIS — M25551 Pain in right hip: Secondary | ICD-10-CM | POA: Insufficient documentation

## 2024-02-02 DIAGNOSIS — M545 Low back pain, unspecified: Secondary | ICD-10-CM

## 2024-02-02 DIAGNOSIS — M5136 Other intervertebral disc degeneration, lumbar region with discogenic back pain only: Secondary | ICD-10-CM | POA: Diagnosis not present

## 2024-02-02 DIAGNOSIS — M4316 Spondylolisthesis, lumbar region: Secondary | ICD-10-CM | POA: Diagnosis not present

## 2024-02-02 DIAGNOSIS — M25552 Pain in left hip: Secondary | ICD-10-CM | POA: Diagnosis not present

## 2024-02-02 DIAGNOSIS — G8929 Other chronic pain: Secondary | ICD-10-CM | POA: Diagnosis not present

## 2024-02-02 DIAGNOSIS — M48061 Spinal stenosis, lumbar region without neurogenic claudication: Secondary | ICD-10-CM | POA: Diagnosis not present

## 2024-02-02 DIAGNOSIS — R937 Abnormal findings on diagnostic imaging of other parts of musculoskeletal system: Secondary | ICD-10-CM

## 2024-02-02 MED ORDER — MELOXICAM 7.5 MG PO TABS: 15.0000 mg | ORAL_TABLET | Freq: Every day | ORAL | 2 refills | Status: DC

## 2024-02-07 ENCOUNTER — Ambulatory Visit: Admission: RE | Admit: 2024-02-07 | Payer: BC Managed Care – PPO | Source: Ambulatory Visit

## 2024-02-16 ENCOUNTER — Ambulatory Visit: Admission: RE | Admit: 2024-02-16 | Payer: BC Managed Care – PPO | Source: Ambulatory Visit

## 2024-02-18 ENCOUNTER — Ambulatory Visit
Admission: RE | Admit: 2024-02-18 | Discharge: 2024-02-18 | Disposition: A | Discharge status: Home / Self Care | Payer: BC Managed Care – PPO | Source: Ambulatory Visit | Attending: Physician Assistant | Admitting: Physician Assistant

## 2024-02-18 DIAGNOSIS — M4316 Spondylolisthesis, lumbar region: Secondary | ICD-10-CM | POA: Diagnosis not present

## 2024-02-18 DIAGNOSIS — M47816 Spondylosis without myelopathy or radiculopathy, lumbar region: Secondary | ICD-10-CM | POA: Diagnosis not present

## 2024-02-18 DIAGNOSIS — M48061 Spinal stenosis, lumbar region without neurogenic claudication: Secondary | ICD-10-CM | POA: Diagnosis not present

## 2024-02-18 DIAGNOSIS — M545 Low back pain, unspecified: Secondary | ICD-10-CM | POA: Insufficient documentation

## 2024-02-21 ENCOUNTER — Other Ambulatory Visit: Payer: Self-pay | Admitting: Physician Assistant

## 2024-02-21 DIAGNOSIS — M25551 Pain in right hip: Secondary | ICD-10-CM

## 2024-02-21 DIAGNOSIS — M545 Low back pain, unspecified: Secondary | ICD-10-CM

## 2024-02-23 ENCOUNTER — Ambulatory Visit
Admission: RE | Admit: 2024-02-23 | Discharge: 2024-02-23 | Disposition: A | Discharge status: Home / Self Care | Source: Ambulatory Visit | Attending: Nurse Practitioner

## 2024-02-23 ENCOUNTER — Ambulatory Visit
Admission: RE | Admit: 2024-02-23 | Discharge: 2024-02-23 | Disposition: A | Discharge status: Home / Self Care | Attending: Nurse Practitioner | Admitting: Nurse Practitioner

## 2024-02-23 ENCOUNTER — Ambulatory Visit (INDEPENDENT_AMBULATORY_CARE_PROVIDER_SITE_OTHER): Admitting: Nurse Practitioner

## 2024-02-23 ENCOUNTER — Encounter: Payer: Self-pay | Admitting: Nurse Practitioner

## 2024-02-23 VITALS — BP 145/88 | HR 100 | Temp 98.0°F | Resp 16 | Ht 64.0 in | Wt 152.0 lb

## 2024-02-23 DIAGNOSIS — R051 Acute cough: Secondary | ICD-10-CM | POA: Diagnosis not present

## 2024-02-23 DIAGNOSIS — R062 Wheezing: Secondary | ICD-10-CM | POA: Insufficient documentation

## 2024-02-23 DIAGNOSIS — J189 Pneumonia, unspecified organism: Secondary | ICD-10-CM | POA: Diagnosis not present

## 2024-02-23 DIAGNOSIS — M48062 Spinal stenosis, lumbar region with neurogenic claudication: Secondary | ICD-10-CM

## 2024-02-23 DIAGNOSIS — R059 Cough, unspecified: Secondary | ICD-10-CM | POA: Diagnosis not present

## 2024-02-23 MED ORDER — TRAMADOL HCL 50 MG PO TABS: 100.0000 mg | ORAL_TABLET | Freq: Two times a day (BID) | ORAL | 2 refills | Status: DC

## 2024-02-23 MED ORDER — LEVOFLOXACIN 750 MG PO TABS: 750.0000 mg | ORAL_TABLET | Freq: Every day | ORAL | 0 refills | Status: DC

## 2024-02-23 MED ORDER — PREDNISONE 10 MG (21) PO TBPK: ORAL_TABLET | ORAL | 0 refills | Status: DC

## 2024-02-23 NOTE — Progress Notes (Signed)
 [facility] [address] [place], [place] [address]  Internal MEDICINE  Office Visit Note  Patient Name: Amy Ferrell  [id number]  [id number]  Date of Service: 02/23/2024  Chief Complaint  Patient presents with   Acute Visit    -covid and flu    Diarrhea   Cough     HPI Amy Ferrell presents for an acute sick visit for a respiratory infection.  --onset of symptoms was 2 days ago -- nasal congestion, heaving, gagging on mucous, sinus drainage, diarrhea, decreased appetite, cough, chills, body aches, chest tightness, wheezing, SOB --tried mucinex DM, dayquil, nyquil, tylenol, ibuprofen     Current Medication:  Outpatient Encounter Medications as of 02/23/2024  Medication Sig   acetaminophen (TYLENOL) 500 MG tablet Take 1,000 mg by mouth every 8 (eight) hours as needed.   albuterol (PROVENTIL) (2.5 MG/3ML) 0.083% nebulizer solution Take 3 mLs (2.5 mg total) by nebulization every 4 (four) hours as needed for wheezing or shortness of breath.   albuterol (VENTOLIN HFA) 108 (90 Base) MCG/ACT inhaler INHALE 2 PUFFS BY MOUTH EVERY 6 HOURS AS NEEDED   amLODipine (NORVASC) 10 MG tablet Take 1 tablet (10 mg total) by mouth daily.   Budeson-Glycopyrrol-Formoterol (BREZTRI AEROSPHERE) 160-9-4.8 MCG/ACT AERO Inhale 2 puffs into the lungs 2 (two) times daily.   ibuprofen (ADVIL) 800 MG tablet Take 800 mg by mouth every 8 (eight) hours as needed.   levofloxacin (LEVAQUIN) 750 MG tablet Take 1 tablet (750 mg total) by mouth daily.   lovastatin (MEVACOR) 20 MG tablet Take 1 tablet (20 mg total) by mouth at bedtime.   meloxicam (MOBIC) 7.5 MG tablet Take 2 tablets (15 mg total) by mouth daily.   mupirocin ointment (BACTROBAN) 2 % Apply 1 Application topically daily. To affected area of inner thighs until healed   omeprazole (PRILOSEC) 40 MG capsule Take 1 capsule (40 mg total) by mouth 2 (two) times daily.   predniSONE (DELTASONE) 10 MG tablet Use as directed for 6 days taper    predniSONE (STERAPRED UNI-PAK 21 TAB) 10 MG (21) TBPK tablet Use as directed for 6 days   traMADol (ULTRAM) 50 MG tablet Take 2 tablets (100 mg total) by mouth 2 (two) times daily.   No facility-administered encounter medications on file as of 02/23/2024.      Medical History: Past Medical History:  Diagnosis Date   Complication of anesthesia    2004 spinal did not work    COPD (chronic obstructive pulmonary disease) (HCC)    Depression    Essential hypertension    Family history of adverse reaction to anesthesia    mom slow to wake up   GERD (gastroesophageal reflux disease)    Hypertension    Moderate aortic insufficiency    a. 02/2023 Echo: EF 60-65%, no rwma, nl RV fxn, RVSP . Mild MR. Mod AI.   Nonosbstructive CAD (coronary artery disease)    a. 01/2023 Cor CTA: Ca2+ = 18.6 (85th%'ile). LM nl, LAD <25p, LCX nl, RCA nl.   Ovarian tumor    PSVT (paroxysmal supraventricular tachycardia) (HCC)    a. 01/2023 Zio: Predominantly sinus rhythm with average of 56 (56-123).  Rare PACs and PVCs.  7 brief atrial runs (fastest 19 beats up to 174).  Symptoms noted with 5 SVT runs and PACs.   Renal disorder    conjoined kidney   UTI (urinary tract infection)      Vital Signs: BP (!) 145/88   Pulse 100   Temp 98  F (36.7 C)   Resp 16   Ht 5\' 4"  (1.626 m)   Wt 152 lb (68.9 kg)   SpO2 98%   BMI 26.09 kg/m    Review of Systems  Constitutional:  Positive for activity change, appetite change, chills and fatigue. Negative for fever.  HENT:  Positive for congestion, ear pain, postnasal drip, rhinorrhea, sinus pressure, sinus pain, sneezing and sore throat.   Respiratory:  Positive for cough, chest tightness, shortness of breath and wheezing.   Cardiovascular: Negative.  Negative for chest pain and palpitations.  Gastrointestinal:  Positive for diarrhea.  Musculoskeletal:  Positive for myalgias (body aches).  Neurological:  Positive for headaches.    Physical Exam Vitals  reviewed.  Constitutional:      Appearance: Normal appearance. She is ill-appearing.  HENT:     Head: Normocephalic and atraumatic.     Nose: Congestion and rhinorrhea present.     Mouth/Throat:     Mouth: Mucous membranes are moist.     Pharynx: Posterior oropharyngeal erythema present.  Eyes:     Pupils: Pupils are equal, round, and reactive to light.  Cardiovascular:     Heart sounds: Normal heart sounds. No murmur heard. Pulmonary:     Effort: Tachypnea and accessory muscle usage present. No respiratory distress.     Breath sounds: Decreased air movement present. Examination of the right-upper field reveals wheezing. Examination of the left-upper field reveals wheezing. Examination of the right-middle field reveals decreased breath sounds and wheezing. Examination of the left-middle field reveals wheezing. Examination of the right-lower field reveals decreased breath sounds and wheezing. Examination of the left-lower field reveals decreased breath sounds and wheezing. Decreased breath sounds and wheezing present.  Skin:    Capillary Refill: Capillary refill takes less than 2 seconds.  Neurological:     Mental Status: She is alert and oriented to person, place, and time.  Psychiatric:        Mood and Affect: Mood normal.        Behavior: Behavior normal.       Assessment/Plan: 1. Community acquired pneumonia of both lungs (Primary) Levofloxacin and prednisone taper prescribed, take until gone. Chest xray ordered  - levofloxacin (LEVAQUIN) 750 MG tablet; Take 1 tablet (750 mg total) by mouth daily.  Dispense: 7 tablet; Refill: 0 - predniSONE (STERAPRED UNI-PAK 21 TAB) 10 MG (21) TBPK tablet; Use as directed for 6 days  Dispense: 21 tablet; Refill: 0 - DG Chest 2 View; Future  2. Wheezing Levofloxacin and prednisone taper prescribed, take until gone. Chest xray ordered  - levofloxacin (LEVAQUIN) 750 MG tablet; Take 1 tablet (750 mg total) by mouth daily.  Dispense: 7 tablet;  Refill: 0 - predniSONE (STERAPRED UNI-PAK 21 TAB) 10 MG (21) TBPK tablet; Use as directed for 6 days  Dispense: 21 tablet; Refill: 0 - DG Chest 2 View; Future  3. Acute cough Chest xray ordered  - DG Chest 2 View; Future  4. Spinal stenosis of lumbar region with neurogenic claudication Continue tramadol as needed as prescribed  - traMADol (ULTRAM) 50 MG tablet; Take 2 tablets (100 mg total) by mouth 2 (two) times daily.  Dispense: 120 tablet; Refill: 2    General Counseling: Amy Ferrell verbalizes understanding of the findings of todays visit and agrees with plan of treatment. I have discussed any further diagnostic evaluation that may be needed or ordered today. We also reviewed her medications today. she has been encouraged to call the office with any questions or concerns that  should arise related to todays visit.    Counseling:    Orders Placed This Encounter  Procedures   DG Chest 2 View    Meds ordered this encounter  Medications   levofloxacin (LEVAQUIN) 750 MG tablet    Sig: Take 1 tablet (750 mg total) by mouth daily.    Dispense:  7 tablet    Refill:  0    Fill new script today asap   predniSONE (STERAPRED UNI-PAK 21 TAB) 10 MG (21) TBPK tablet    Sig: Use as directed for 6 days    Dispense:  21 tablet    Refill:  0   traMADol (ULTRAM) 50 MG tablet    Sig: Take 2 tablets (100 mg total) by mouth 2 (two) times daily.    Dispense:  120 tablet    Refill:  2    Not new script switching from extended release back to regular tramadol, refill today    Return if symptoms worsen or fail to improve.  [place] Controlled Substance Database was reviewed by me for overdose risk score (ORS)  Time spent:30 Minutes Time spent with patient included reviewing progress notes, labs, imaging studies, and discussing plan for follow up.   This patient was seen by Laurence Pons, FNP-C in collaboration with Dr. Verneta Gone as a part of collaborative care agreement.  Neleh Muldoon R.  Bobbi Burow, MSN, FNP-C Internal Medicine

## 2024-02-24 ENCOUNTER — Telehealth: Payer: Self-pay

## 2024-02-24 NOTE — Telephone Encounter (Signed)
 Patient notified

## 2024-02-24 NOTE — Progress Notes (Signed)
 Chest xray was negative for pneumonia but so this is most likely a COPD exacerbation which was brought on by a viral respiratory infection so she should continue the medications as prescribed and finish the antibiotic until it is gone.

## 2024-02-24 NOTE — Telephone Encounter (Signed)
-----   Message from [facility] sent at 02/24/2024  7:39 AM EST ----- Chest xray was negative for pneumonia but so this is most likely a COPD exacerbation which was brought on by a viral respiratory infection so she should continue the medications as prescribed and finish the antibiotic until it is gone.

## 2024-02-28 ENCOUNTER — Telehealth: Payer: Self-pay | Admitting: Nurse Practitioner

## 2024-02-28 ENCOUNTER — Encounter: Payer: Self-pay | Admitting: Nurse Practitioner

## 2024-02-28 DIAGNOSIS — R062 Wheezing: Secondary | ICD-10-CM

## 2024-02-28 DIAGNOSIS — J189 Pneumonia, unspecified organism: Secondary | ICD-10-CM

## 2024-02-29 MED ORDER — HYDROCOD POLI-CHLORPHE POLI ER 10-8 MG/5ML PO SUER: 5.0000 mL | Freq: Two times a day (BID) | ORAL | 0 refills | Status: DC | PRN

## 2024-02-29 MED ORDER — PREDNISONE 10 MG (21) PO TBPK: ORAL_TABLET | ORAL | 0 refills | Status: DC

## 2024-02-29 NOTE — Telephone Encounter (Signed)
 Pt.notified

## 2024-02-29 NOTE — Telephone Encounter (Signed)
 Patient notified

## 2024-03-13 ENCOUNTER — Other Ambulatory Visit: Payer: Self-pay | Admitting: Nurse Practitioner

## 2024-03-13 ENCOUNTER — Other Ambulatory Visit: Payer: Self-pay | Admitting: Physician Assistant

## 2024-03-13 DIAGNOSIS — M545 Low back pain, unspecified: Secondary | ICD-10-CM

## 2024-04-01 ENCOUNTER — Encounter: Payer: Self-pay | Admitting: Nurse Practitioner

## 2024-04-06 ENCOUNTER — Other Ambulatory Visit: Payer: Self-pay | Admitting: Nurse Practitioner

## 2024-05-08 ENCOUNTER — Other Ambulatory Visit: Payer: Self-pay | Admitting: Nurse Practitioner

## 2024-05-08 DIAGNOSIS — I10 Essential (primary) hypertension: Secondary | ICD-10-CM

## 2024-05-15 ENCOUNTER — Other Ambulatory Visit: Payer: Self-pay | Admitting: Nurse Practitioner

## 2024-05-15 DIAGNOSIS — I10 Essential (primary) hypertension: Secondary | ICD-10-CM

## 2024-05-16 ENCOUNTER — Ambulatory Visit: Admitting: Student in an Organized Health Care Education/Training Program

## 2024-05-16 ENCOUNTER — Other Ambulatory Visit: Payer: Self-pay | Admitting: Nurse Practitioner

## 2024-05-18 ENCOUNTER — Ambulatory Visit: Admitting: Student in an Organized Health Care Education/Training Program

## 2024-05-23 ENCOUNTER — Other Ambulatory Visit: Payer: Self-pay | Admitting: Nurse Practitioner

## 2024-05-23 DIAGNOSIS — M48062 Spinal stenosis, lumbar region with neurogenic claudication: Secondary | ICD-10-CM

## 2024-05-31 ENCOUNTER — Other Ambulatory Visit: Payer: Self-pay | Admitting: Nurse Practitioner

## 2024-05-31 DIAGNOSIS — M48062 Spinal stenosis, lumbar region with neurogenic claudication: Secondary | ICD-10-CM

## 2024-05-31 MED ORDER — TRAMADOL HCL 50 MG PO TABS: 100.0000 mg | ORAL_TABLET | Freq: Two times a day (BID) | ORAL | 0 refills | Status: DC

## 2024-06-12 ENCOUNTER — Other Ambulatory Visit: Payer: Self-pay | Admitting: Nurse Practitioner

## 2024-06-14 ENCOUNTER — Ambulatory Visit: Admitting: Nurse Practitioner

## 2024-06-19 ENCOUNTER — Ambulatory Visit: Admitting: Nurse Practitioner

## 2024-06-19 ENCOUNTER — Encounter: Payer: Self-pay | Admitting: Nurse Practitioner

## 2024-06-19 ENCOUNTER — Ambulatory Visit (INDEPENDENT_AMBULATORY_CARE_PROVIDER_SITE_OTHER): Admitting: Nurse Practitioner

## 2024-06-19 VITALS — BP 135/88 | HR 94 | Temp 98.3°F | Resp 16 | Ht 64.0 in | Wt 150.6 lb

## 2024-06-19 DIAGNOSIS — D229 Melanocytic nevi, unspecified: Secondary | ICD-10-CM

## 2024-06-19 DIAGNOSIS — I10 Essential (primary) hypertension: Secondary | ICD-10-CM | POA: Diagnosis not present

## 2024-06-19 DIAGNOSIS — E782 Mixed hyperlipidemia: Secondary | ICD-10-CM | POA: Diagnosis not present

## 2024-06-19 DIAGNOSIS — F5101 Primary insomnia: Secondary | ICD-10-CM

## 2024-06-19 DIAGNOSIS — M48062 Spinal stenosis, lumbar region with neurogenic claudication: Secondary | ICD-10-CM | POA: Diagnosis not present

## 2024-06-19 MED ORDER — TRAZODONE HCL 100 MG PO TABS: 50.0000 mg | ORAL_TABLET | Freq: Every day | ORAL | 5 refills | Status: AC

## 2024-06-19 MED ORDER — TRAMADOL HCL 50 MG PO TABS: 100.0000 mg | ORAL_TABLET | Freq: Two times a day (BID) | ORAL | 2 refills | Status: AC

## 2024-06-19 MED ORDER — AMLODIPINE BESYLATE 10 MG PO TABS: 10.0000 mg | ORAL_TABLET | Freq: Every day | ORAL | 1 refills | Status: AC

## 2024-06-19 NOTE — Progress Notes (Signed)
 [facility] [address] [place], [place] [address]  Internal MEDICINE  Office Visit Note  Patient Name: Amy Ferrell  [id number]  [id number]  Date of Service: 06/19/2024  Chief Complaint  Patient presents with   Depression   Gastroesophageal Reflux   Hypertension   Follow-up    HPI Amy Ferrell presents for a follow-up visit for hypertension, low back pain, insomnia, skin moles, high cholesterol and COPD.  Hypertension --controlled with amlodipine  Low back pain -- chronic pain, takes tramadol  as needed.  Insomnia -- taking trazodone  for sleep which is effective, due for refills.  Multiple skin moles -- wants to see dermatology for TBSE for multiple atypical skin moles.  High cholesterol -- taking lovastatin   COPD -- using breztri  twice daily.     Current Medication: Outpatient Encounter Medications as of 06/19/2024  Medication Sig   acetaminophen  (TYLENOL ) 500 MG tablet Take 1,000 mg by mouth every 8 (eight) hours as needed.   albuterol  (PROVENTIL ) (2.5 MG/3ML) 0.083% nebulizer solution Take 3 mLs (2.5 mg total) by nebulization every 4 (four) hours as needed for wheezing or shortness of breath.   amLODipine  (NORVASC ) 10 MG tablet Take 1 tablet (10 mg total) by mouth daily.   Budeson-Glycopyrrol-Formoterol (BREZTRI  AEROSPHERE) 160-9-4.8 MCG/ACT AERO Inhale 2 puffs into the lungs 2 (two) times daily.   ibuprofen  (ADVIL ) 800 MG tablet Take 800 mg by mouth every 8 (eight) hours as needed.   lovastatin  (MEVACOR ) 20 MG tablet Take 1 tablet (20 mg total) by mouth at bedtime.   mupirocin  ointment (BACTROBAN ) 2 % Apply 1 Application topically daily. To affected area of inner thighs until healed (Patient not taking: Reported on 07/18/2024)   omeprazole  (PRILOSEC) 40 MG capsule Take 1 capsule (40 mg total) by mouth 2 (two) times daily.   traMADol  (ULTRAM ) 50 MG tablet Take 2 tablets (100 mg total) by mouth 2 (two) times daily.   traZODone  (DESYREL ) 100 MG tablet Take 0.5-1  tablets (50-100 mg total) by mouth at bedtime.   [DISCONTINUED] amLODipine  (NORVASC ) 10 MG tablet Take 1 tablet by mouth once daily   [DISCONTINUED] chlorpheniramine-HYDROcodone  (TUSSIONEX) 10-8 MG/5ML Take 5 mLs by mouth every 12 (twelve) hours as needed for cough.   [DISCONTINUED] levofloxacin  (LEVAQUIN ) 750 MG tablet Take 1 tablet (750 mg total) by mouth daily.   [DISCONTINUED] meloxicam  (MOBIC ) 7.5 MG tablet Take 2 tablets (15 mg total) by mouth daily.   [DISCONTINUED] predniSONE  (STERAPRED UNI-PAK 21 TAB) 10 MG (21) TBPK tablet Use as directed for 6 days   [DISCONTINUED] traMADol  (ULTRAM ) 50 MG tablet Take 2 tablets (100 mg total) by mouth 2 (two) times daily.   [DISCONTINUED] VENTOLIN  HFA 108 (90 Base) MCG/ACT inhaler INHALE 2 PUFFS BY MOUTH EVERY 6 HOURS AS NEEDED   No facility-administered encounter medications on file as of 06/19/2024.    Surgical History: Past Surgical History:  Procedure Laterality Date   ABDOMINAL HYSTERECTOMY     appebdectomy     APPENDECTOMY     CESAREAN SECTION     x3    CHOLECYSTECTOMY     LAPAROSCOPIC SALPINGO OOPHERECTOMY Right 10/16/2019   Procedure: LAPAROSCOPIC SALPINGO OOPHORECTOMY;  Surgeon: Janit Alm Agent, MD;  Location: [facility] ORS;  Service: Gynecology;  Laterality: Right;   VESICOVAGINAL FISTULA CLOSURE W/ TAH      Medical History: Past Medical History:  Diagnosis Date   Complication of anesthesia    2004 spinal did not work    COPD (chronic obstructive pulmonary disease) (HCC)  Depression    Essential hypertension    Family history of adverse reaction to anesthesia    mom slow to wake up   GERD (gastroesophageal reflux disease)    Hypertension    Moderate aortic insufficiency    a. 02/2023 Echo: EF 60-65%, no rwma, nl RV fxn, RVSP . Mild MR. Mod AI.   Nonosbstructive CAD (coronary artery disease)    a. 01/2023 Cor CTA: Ca2+ = 18.6 (85th%'ile). LM nl, LAD <25p, LCX nl, RCA nl.   Ovarian tumor    PSVT (paroxysmal  supraventricular tachycardia) (HCC)    a. 01/2023 Zio: Predominantly sinus rhythm with average of 56 (56-123).  Rare PACs and PVCs.  7 brief atrial runs (fastest 19 beats up to 174).  Symptoms noted with 5 SVT runs and PACs.   Renal disorder    conjoined kidney   UTI (urinary tract infection)     Family History: Family History  Problem Relation Age of Onset   Hypertension Mother    Ovarian cancer Mother    Congestive Heart Failure Mother    Aortic aneurysm Maternal Grandmother    Heart attack Maternal Grandmother        Apparently had several.   Coronary artery disease Other        GF   COPD Maternal Aunt        Lymphedema   Breast cancer Neg Hx     Social History   Socioeconomic History   Marital status: Married    Spouse name: Not on file   Number of children: Not on file   Years of education: Not on file   Highest education level: Not on file  Occupational History   Not on file  Tobacco Use   Smoking status: Every Day    Current packs/day: 1.00    Average packs/day: 1 pack/day for 37.6 years (37.6 ttl pk-yrs)    Types: Cigarettes    Start date: 11/17/1983    Last attempt to quit: 11/16/2020   Smokeless tobacco: Never   Tobacco comments:    Smokes over a pack a day.  Vaping Use   Vaping status: Never Used  Substance and Sexual Activity   Alcohol use: Not Currently    Comment: occasional   Drug use: Yes    Types: Marijuana    Comment: occasional   Sexual activity: Yes    Birth control/protection: Surgical  Other Topics Concern   Not on file  Social History Narrative   Single.    Social Drivers of Corporate investment banker Strain: Not on file  Food Insecurity: Not on file  Transportation Needs: Not on file  Physical Activity: Not on file  Stress: Not on file  Social Connections: Not on file  Intimate Partner Violence: Not on file      Review of Systems  Constitutional:  Negative for chills, fatigue and unexpected weight change.  HENT:   Negative for congestion, postnasal drip, rhinorrhea, sneezing and sore throat.   Eyes:  Negative for redness.  Respiratory:  Positive for cough and shortness of breath. Negative for chest tightness and wheezing.   Cardiovascular: Negative.  Negative for chest pain and palpitations.  Gastrointestinal:  Negative for abdominal pain, constipation, diarrhea, nausea and vomiting.  Genitourinary:  Negative for dysuria and frequency.  Musculoskeletal:  Positive for arthralgias and back pain. Negative for joint swelling and neck pain.  Skin:  Negative for rash.  Neurological: Negative.  Negative for tremors and numbness.  Hematological:  Negative  for adenopathy. Does not bruise/bleed easily.  Psychiatric/Behavioral:  Positive for behavioral problems (Depression) and sleep disturbance. Negative for self-injury and suicidal ideas. The patient is nervous/anxious.     Vital Signs: BP 135/88   Pulse 94   Temp 98.3 F (36.8 C)   Resp 16   Ht 5' 4 (1.626 m)   Wt 150 lb 9.6 oz (68.3 kg)   SpO2 96%   BMI 25.85 kg/m    Physical Exam Vitals reviewed.  Constitutional:      Appearance: Normal appearance.  HENT:     Head: Normocephalic and atraumatic.  Eyes:     Pupils: Pupils are equal, round, and reactive to light.  Cardiovascular:     Rate and Rhythm: Normal rate and regular rhythm.  Skin:    Capillary Refill: Capillary refill takes less than 2 seconds.  Neurological:     Mental Status: She is alert and oriented to person, place, and time.  Psychiatric:        Mood and Affect: Mood normal.        Behavior: Behavior normal.        Assessment/Plan: 1. Primary hypertension Stable, continue amlodipine  as prescribed.  - amLODipine  (NORVASC ) 10 MG tablet; Take 1 tablet (10 mg total) by mouth daily.  Dispense: 90 tablet; Refill: 1  2. Mixed hyperlipidemia Continue rosuvastatin as prescribed.   3. Spinal stenosis of lumbar region with neurogenic claudication Continue prn tramadol  as  prescribed, follow up in 3 months for additional refills.  - traMADol  (ULTRAM ) 50 MG tablet; Take 2 tablets (100 mg total) by mouth 2 (two) times daily.  Dispense: 120 tablet; Refill: 2  4. Multiple atypical skin moles (Primary) Referred to dermatology - Ambulatory referral to Dermatology  5. Primary insomnia Continue trazodone  as prescribed.  - traZODone  (DESYREL ) 100 MG tablet; Take 0.5-1 tablets (50-100 mg total) by mouth at bedtime.  Dispense: 30 tablet; Refill: 5   General Counseling: D[place]ota verbalizes understanding of the findings of todays visit and agrees with plan of treatment. I have discussed any further diagnostic evaluation that may be needed or ordered today. We also reviewed her medications today. she has been encouraged to call the office with any questions or concerns that should arise related to todays visit.    Orders Placed This Encounter  Procedures   Ambulatory referral to Dermatology    Meds ordered this encounter  Medications   amLODipine  (NORVASC ) 10 MG tablet    Sig: Take 1 tablet (10 mg total) by mouth daily.    Dispense:  90 tablet    Refill:  1   traZODone  (DESYREL ) 100 MG tablet    Sig: Take 0.5-1 tablets (50-100 mg total) by mouth at bedtime.    Dispense:  30 tablet    Refill:  5   traMADol  (ULTRAM ) 50 MG tablet    Sig: Take 2 tablets (100 mg total) by mouth 2 (two) times daily.    Dispense:  120 tablet    Refill:  2    For future refills    Return in about 3 months (around 09/12/2024) for F/U, pain med refill, Kayman Snuffer PCP.   Total time spent:30 Minutes Time spent includes review of chart, medications, test results, and follow up plan with the patient.   [place] Controlled Substance Database was reviewed by me.  This patient was seen by Mardy Maxin, FNP-C in collaboration with Dr. Sigrid Bathe as a part of collaborative care agreement.   Philipp Callegari R. Maxin, MSN, FNP-C Internal  medicine

## 2024-06-21 ENCOUNTER — Telehealth: Payer: Self-pay | Admitting: Nurse Practitioner

## 2024-06-21 NOTE — Telephone Encounter (Signed)
 Awaiting 06/19/24 office notes for Dermatology referral-Amy Ferrell

## 2024-06-27 ENCOUNTER — Ambulatory Visit
Attending: Student in an Organized Health Care Education/Training Program | Admitting: Student in an Organized Health Care Education/Training Program

## 2024-07-04 ENCOUNTER — Ambulatory Visit: Admitting: Dermatology

## 2024-07-05 ENCOUNTER — Encounter

## 2024-07-10 ENCOUNTER — Other Ambulatory Visit: Payer: Self-pay | Admitting: Nurse Practitioner

## 2024-07-18 ENCOUNTER — Ambulatory Visit
Attending: Student in an Organized Health Care Education/Training Program | Admitting: Student in an Organized Health Care Education/Training Program

## 2024-07-18 ENCOUNTER — Encounter: Payer: Self-pay | Admitting: Student in an Organized Health Care Education/Training Program

## 2024-07-18 VITALS — BP 128/80 | HR 75 | Temp 98.0°F | Resp 16 | Ht 64.0 in | Wt 160.0 lb

## 2024-07-18 DIAGNOSIS — M533 Sacrococcygeal disorders, not elsewhere classified: Secondary | ICD-10-CM | POA: Insufficient documentation

## 2024-07-18 DIAGNOSIS — M5416 Radiculopathy, lumbar region: Secondary | ICD-10-CM | POA: Diagnosis not present

## 2024-07-18 DIAGNOSIS — M48062 Spinal stenosis, lumbar region with neurogenic claudication: Secondary | ICD-10-CM | POA: Insufficient documentation

## 2024-07-18 DIAGNOSIS — M51362 Other intervertebral disc degeneration, lumbar region with discogenic back pain and lower extremity pain: Secondary | ICD-10-CM | POA: Diagnosis not present

## 2024-07-18 NOTE — Patient Instructions (Signed)
 ______________________________________________________________________    Procedure instructions  Stop blood-thinners  Do not eat or drink fluids (other than water) for 6 hours before your procedure  No water for 2 hours before your procedure  Take your blood pressure medicine with a sip of water  Arrive 30 minutes before your appointment  If sedation is planned, bring suitable driver. [place], [place], & public transportation are NOT APPROVED)  Carefully read the Preparing for your procedure detailed instructions  If you have questions call us  at [phone]  Procedure appointments are for procedures only.   NO medication refills or new problem evaluations will be done on procedure days.   Only the scheduled, pre-approved procedure and side will be done.   ______________________________________________________________________      ______________________________________________________________________    Post-Procedure Discharge Instructions  Instructions: Apply ice:  Purpose: This will minimize any swelling and discomfort after procedure.  When: Day of procedure, as soon as you get home. How: Fill a plastic sandwich bag with crushed ice. Cover it with a small towel and apply to injection site. How long: (15 min on, 15 min off) Apply for 15 minutes then remove x 15 minutes.  Repeat sequence on day of procedure, until you go to bed. Apply heat:  Purpose: To treat any soreness and discomfort from the procedure. When: Starting the next day after the procedure. How: Apply heat to procedure site starting the day following the procedure. How long: May continue to repeat daily, until discomfort goes away. Food intake: Start with clear liquids (like water) and advance to regular food, as tolerated.  Physical activities: Keep activities to a minimum for the first 8 hours after the procedure. After that, then as tolerated. Driving: If you have received any sedation, be  responsible and do not drive. You are not allowed to drive for 24 hours after having sedation. Blood thinner: (Applies only to those taking blood thinners) You may restart your blood thinner 6 hours after your procedure. Insulin: (Applies only to Diabetic patients taking insulin) As soon as you can eat, you may resume your normal dosing schedule. Infection prevention: Keep procedure site clean and dry. Shower daily and clean area with soap and water. Post-procedure Pain Diary: Extremely important that this be done correctly and accurately. Recorded information will be used to determine the next step in treatment. For the purpose of accuracy, follow these rules: Evaluate only the area treated. Do not report or include pain from an untreated area. For the purpose of this evaluation, ignore all other areas of pain, except for the treated area. After your procedure, avoid taking a long nap and attempting to complete the pain diary after you wake up. Instead, set your alarm clock to go off every hour, on the hour, for the initial 8 hours after the procedure. Document the duration of the numbing medicine, and the relief you are getting from it. Do not go to sleep and attempt to complete it later. It will not be accurate. If you received sedation, it is likely that you were given a medication that may cause amnesia. Because of this, completing the diary at a later time may cause the information to be inaccurate. This information is needed to plan your care. Follow-up appointment: Keep your post-procedure follow-up evaluation appointment after the procedure (usually 2 weeks for most procedures, 6 weeks for radiofrequencies). DO NOT FORGET to bring you pain diary with you.   Expect: (What should I expect to see with my procedure?) From numbing medicine (AKA:  Local Anesthetics): Numbness or decrease in pain. You may also experience some weakness, which if present, could last for the duration of the local  anesthetic. Onset: Full effect within 15 minutes of injected. Duration: It will depend on the type of local anesthetic used. On the average, 1 to 8 hours.  From steroids (Applies only if steroids were used): Decrease in swelling or inflammation. Once inflammation is improved, relief of the pain will follow. Onset of benefits: Depends on the amount of swelling present. The more swelling, the longer it will take for the benefits to be seen. In some cases, up to 10 days. Duration: Steroids will stay in the system x 2 weeks. Duration of benefits will depend on multiple posibilities including persistent irritating factors. Side-effects: If present, they may typically last 2 weeks (the duration of the steroids). Frequent: Cramps (if they occur, drink Gatorade and take over-the-counter Magnesium 450-500 mg once to twice a day); water retention with temporary weight gain; increases in blood sugar; decreased immune system response; increased appetite. Occasional: Facial flushing (red, warm cheeks); mood swings; menstrual changes. Uncommon: Long-term decrease or suppression of natural hormones; bone thinning. (These are more common with higher doses or more frequent use. This is why we prefer that our patients avoid having any injection therapies in other practices.)  Very Rare: Severe mood changes; psychosis; aseptic necrosis. From procedure: Some discomfort is to be expected once the numbing medicine wears off. This should be minimal if ice and heat are applied as instructed.  Call if: (When should I call?) You experience numbness and weakness that gets worse with time, as opposed to wearing off. New onset bowel or bladder incontinence. (Applies only to procedures done in the spine)  Emergency Numbers: Durning business hours (Monday - Thursday, 8:00 AM - 4:00 PM) (Friday, 9:00 AM - 12:00 Noon): [phone] After hours: [phone] NOTE: If you are having a problem and are unable connect with, or to  talk to a provider, then go to your nearest urgent care or emergency department. If the problem is serious and urgent, please call 911. ______________________________________________________________________

## 2024-07-18 NOTE — Progress Notes (Signed)
 Safety precautions to be maintained throughout the outpatient stay will include: orient to surroundings, keep bed in low position, maintain call bell within reach at all times, provide assistance with transfer out of bed and ambulation.

## 2024-07-18 NOTE — Progress Notes (Signed)
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 adverse reaction to anesthesia    Hypertension    Moderate aortic insufficiency    Nonosbstructive CAD (coronary artery disease)    Ovarian tumor    PSVT (paroxysmal supraventricular tachycardia) (HCC)    Renal disorder    UTI (urinary tract infection)    Constitutional Exam  General appearance: Well nourished, well developed, and well hydrated. In no apparent acute distress Vitals:   07/18/24 1015  BP: 128/80  Pulse: 75  Resp: 16  Temp: 98 F (36.7 C)  SpO2: 99%  Weight: 160 lb (72.6 kg)  Height: 5' 4 (1.626 m)   BMI Assessment: Estimated body mass index is 27.46 kg/m as calculated from the following:   Height as of  this encounter: 5' 4 (1.626 m).   Weight as of this encounter: 160 lb (72.6 kg).  BMI interpretation table: BMI level Category Range association with higher incidence of chronic pain  <18 kg/m2 Underweight   18.5-24.9 kg/m2 Ideal body weight   25-29.9 kg/m2 Overweight Increased incidence by 20%  30-34.9 kg/m2 Obese (Class I) Increased incidence by 68%  35-39.9 kg/m2 Severe obesity (Class II) Increased incidence by 136%  >40 kg/m2 Extreme obesity (Class III) Increased incidence by 254%   Patient's current BMI Ideal Body weight  Body mass index is 27.46 kg/m. Ideal body weight: 54.7 kg (120 lb 9.5  oz) Adjusted ideal body weight: 61.9 kg (136 lb 5.7 oz)   BMI Readings from Last 4 Encounters:  07/18/24 27.46 kg/m  06/19/24 25.85 kg/m  02/23/24 26.09 kg/m  02/02/24 27.09 kg/m   Wt Readings from Last 4 Encounters:  07/18/24 160 lb (72.6 kg)  06/19/24 150 lb 9.6 oz (68.3 kg)  02/23/24 152 lb (68.9 kg)  02/02/24 157 lb 12.8 oz (71.6 kg)    Psych/Mental status: Alert, oriented x 3 (person, place, & time)       Eyes: PERLA Respiratory: No evidence of acute respiratory distress  Lumbar Spine Area Exam  Skin & Axial Inspection: No masses, redness, or swelling Alignment: Symmetrical Functional ROM: Pain restricted ROM affecting both sides Stability: No instability detected Muscle Tone/Strength: Functionally intact. No obvious neuro-muscular anomalies detected. Sensory (Neurological): Dermatomal pain pattern Palpation: No palpable anomalies       Positive straight leg raise  Gait & Posture Assessment  Ambulation: Unassisted Gait: Relatively normal for age and body habitus Posture: WNL  Lower Extremity Exam    Side: Right lower extremity  Side: Left lower extremity  Stability: No instability observed          Stability: No instability observed          Skin & Extremity Inspection: Skin color, temperature, and hair growth are WNL. No peripheral edema or cyanosis. No masses,  redness, swelling, asymmetry, or associated skin lesions. No contractures.  Skin & Extremity Inspection: Skin color, temperature, and hair growth are WNL. No peripheral edema or cyanosis. No masses, redness, swelling, asymmetry, or associated skin lesions. No contractures.  Functional ROM: Unrestricted ROM                  Functional ROM: Unrestricted ROM                  Muscle Tone/Strength: Functionally intact. No obvious neuro-muscular anomalies detected.  Muscle Tone/Strength: Functionally intact. No obvious neuro-muscular anomalies detected.  Sensory (Neurological): Unimpaired        Sensory (Neurological): Unimpaired        DTR: Patellar: deferred today Achilles: deferred today Plantar: deferred today  DTR: Patellar: deferred today Achilles: deferred today Plantar: deferred today  Palpation: No palpable anomalies  Palpation: No palpable anomalies    Assessment  Primary Diagnosis & Pertinent Problem List: The primary encounter diagnosis was Lumbar radiculopathy. Diagnoses of Spinal stenosis, lumbar region, with neurogenic claudication, Degeneration of intervertebral disc of lumbar region with discogenic back pain and lower extremity pain, Sacroiliac joint pain, and Sacral back pain were also pertinent to this visit.  Visit Diagnosis (New problems to examiner): 1. Lumbar radiculopathy   2. Spinal stenosis, lumbar region, with neurogenic claudication   3. Degeneration of intervertebral disc of lumbar region with discogenic back pain and lower extremity pain   4. Sacroiliac joint pain   5. Sacral back pain    Plan of Care (Initial workup plan)  I had extensive discussion with the patient about the goals of pain management.  We discussed nonpharmacological approaches to pain management that include physical therapy, dieting, sleep hygiene, psychotherapy, interventional therapy.  We discussed the importance of understanding the type of pain including neuropathic, nociceptive,  centralized.  I also stressed the importance of multimodal analgesia with an emphasis on nondrug modalities including self management, behavioral health support and physical therapy.  We discussed the importance of physical therapy and how a individualized physical therapy and occupational therapy program tailored to patient limitations can be helpful  at improving physical function. We also discussed the importance of insomnia and disrupted sleep and how improved sleep hygiene and cognitive therapy could be helpful.  Psychotherapy including CBT, mind-body therapies, pain coping strategies can be helpful for patients whose pain impacts mood, sleep, quality of life, relationships with others.  I also had an extensive discussion with the patient about interventional therapies which is my expertise and how these could be incorporated into an effective multimodal pain management plan.  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 MRI reveals a disc bulge at L4-L5 causing spinal stenosis. Symptoms have worsened with prolonged sitting due to a work-from-home setup. Previous epidural injections were administered 15 years ago, but details are unknown. Administer an epidural injection at L4-L5 with local anesthetic and steroid. Offer sedation with Valium if a driver is available. Schedule a follow-up in 3-4 weeks to assess the response to the epidural injection.  I will also refer the patient to formal physical therapy.  She has done a home exercise program but I think she will may also benefit from formal PT.   Sacroiliac joint pain   Sacroiliac joint pain contributes to overall low back pain, with severe pain when transitioning from sitting to standing. No previous  imaging of the sacrum or SI joint has been done. Order x-rays of the sacrum and SI joint to evaluate for degeneration.  Degenerative joint disease of sacrum/coccyx   Degenerative joint disease of the sacrum/coccyx is suspected based on the primary care physician's assessment. Severe pain occurs when transitioning from sitting to standing, suggesting possible degeneration in the tailbone area. Order x-rays of the sacrum to evaluate for degenerative changes.      Patient is high risk for opioid prescribing. The patient will not be a candidate for opioid therapy through this clinic. Patient will be optimized on non-opioid adjunctive analgesics and a comprehensive pain management plan was developed.   Imaging Orders         DG Si Joints         DG Sacrum/Coccyx     Referral Orders         Ambulatory referral to Physical Therapy     Procedure Orders         Lumbar Epidural Injection     Interventional management options: Amy Ferrell was informed that there is no guarantee that she would be a candidate for interventional therapies. The decision will be based on the results of diagnostic studies, as well as Amy Ferrell's risk profile.  Procedure(s) under consideration:  LESI     Provider-requested follow-up: Return in about 13 days (around 07/31/2024) for L4/5 ESI.  Future Appointments  Date Time Provider Department Center  07/31/2024  4:40 PM [facility]  08/14/2024  4:30 PM Claudene Lehmann, MD [facility] None  09/19/2024  4:00 PM Liana Fish, NP [facility] None  11/22/2024  2:40 PM Liana Fish, NP [facility] None   I discussed the assessment and treatment plan with the patient. The patient was provided an opportunity to ask questions and all were answered. The patient agreed with the plan and demonstrated an understanding of the instructions.  Patient advised to call back or seek an in-person evaluation if the symptoms or condition worsens.  Duration of encounter:  .  Total time on encounter, as per AMA guidelines included both the face-to-face and non-face-to-face time personally spent by the physician and/or other qualified health care professional(s) on the day of the encounter (includes time in activities that require the physician or other qualified health care professional and does not include time in activities normally performed by clinical staff). Physician's time may include the following activities when performed: Preparing to see the patient (e.g., pre-charting review of records, searching for previously ordered imaging, lab work, and nerve conduction tests) Review of prior analgesic pharmacotherapies. Reviewing PMP Interpreting ordered tests (e.g., lab work, imaging, nerve conduction tests) Performing post-procedure evaluations, including interpretation of diagnostic procedures Obtaining and/or reviewing separately obtained history Performing a medically appropriate examination and/or evaluation Counseling and educating the patient/family/caregiver Ordering medications, tests, or procedures Referring and communicating with other health care professionals (when not separately reported) Documenting clinical information in the electronic or other health record Independently interpreting results (not separately reported) and communicating results to the patient/ family/caregiver Care coordination (not separately reported)  Note by: Wallie Sherry, MD (TTS and AI technology used. I apologize for any typographical errors that were not detected and corrected.) Date: 07/18/2024; Time: 10:58 AM

## 2024-07-22 ENCOUNTER — Encounter: Payer: Self-pay | Admitting: Nurse Practitioner

## 2024-07-22 DIAGNOSIS — F5101 Primary insomnia: Secondary | ICD-10-CM | POA: Insufficient documentation

## 2024-07-22 DIAGNOSIS — I10 Essential (primary) hypertension: Secondary | ICD-10-CM | POA: Insufficient documentation

## 2024-07-22 DIAGNOSIS — E782 Mixed hyperlipidemia: Secondary | ICD-10-CM | POA: Insufficient documentation

## 2024-07-22 DIAGNOSIS — D229 Melanocytic nevi, unspecified: Secondary | ICD-10-CM | POA: Insufficient documentation

## 2024-07-31 ENCOUNTER — Inpatient Hospital Stay: Admission: RE | Admit: 2024-07-31 | Source: Ambulatory Visit

## 2024-08-02 ENCOUNTER — Ambulatory Visit: Admitting: Student in an Organized Health Care Education/Training Program

## 2024-08-12 ENCOUNTER — Other Ambulatory Visit: Payer: Self-pay | Admitting: Internal Medicine

## 2024-08-12 DIAGNOSIS — J4489 Other specified chronic obstructive pulmonary disease: Secondary | ICD-10-CM

## 2024-08-13 ENCOUNTER — Encounter: Payer: Self-pay | Admitting: Nurse Practitioner

## 2024-08-14 ENCOUNTER — Ambulatory Visit: Admitting: Dermatology

## 2024-08-16 ENCOUNTER — Ambulatory Visit: Admitting: Student in an Organized Health Care Education/Training Program

## 2024-08-30 ENCOUNTER — Ambulatory Visit: Admitting: Student in an Organized Health Care Education/Training Program

## 2024-09-13 ENCOUNTER — Ambulatory Visit: Admitting: Nurse Practitioner

## 2024-09-19 ENCOUNTER — Ambulatory Visit: Admitting: Nurse Practitioner

## 2024-09-26 DIAGNOSIS — K5900 Constipation, unspecified: Secondary | ICD-10-CM | POA: Diagnosis not present

## 2024-09-26 DIAGNOSIS — J439 Emphysema, unspecified: Secondary | ICD-10-CM | POA: Diagnosis not present

## 2024-09-26 DIAGNOSIS — N644 Mastodynia: Secondary | ICD-10-CM | POA: Diagnosis not present

## 2024-09-26 DIAGNOSIS — Z1211 Encounter for screening for malignant neoplasm of colon: Secondary | ICD-10-CM | POA: Diagnosis not present

## 2024-09-27 ENCOUNTER — Telehealth: Payer: Self-pay | Admitting: Internal Medicine

## 2024-09-27 NOTE — Telephone Encounter (Signed)
 Received MR request from [facility]. All records faxed; [phone]. Request scanned-Toni

## 2024-09-28 ENCOUNTER — Telehealth: Payer: Self-pay | Admitting: Internal Medicine

## 2024-09-28 NOTE — Telephone Encounter (Signed)
 Unable to get MR fax to go through. Mailed to: [facility] C/0 Dr. Jonette Minor [address] [place], [place] [address]

## 2024-10-02 ENCOUNTER — Other Ambulatory Visit: Payer: Self-pay

## 2024-10-02 DIAGNOSIS — N644 Mastodynia: Secondary | ICD-10-CM

## 2024-10-24 ENCOUNTER — Other Ambulatory Visit

## 2024-10-24 ENCOUNTER — Encounter

## 2024-10-25 DIAGNOSIS — Z0000 Encounter for general adult medical examination without abnormal findings: Secondary | ICD-10-CM | POA: Diagnosis not present

## 2024-10-25 DIAGNOSIS — Z131 Encounter for screening for diabetes mellitus: Secondary | ICD-10-CM | POA: Diagnosis not present

## 2024-10-25 DIAGNOSIS — Z13220 Encounter for screening for lipoid disorders: Secondary | ICD-10-CM | POA: Diagnosis not present

## 2024-11-01 ENCOUNTER — Inpatient Hospital Stay: Admission: RE | Admit: 2024-11-01 | Source: Ambulatory Visit

## 2024-11-01 ENCOUNTER — Inpatient Hospital Stay: Admission: RE | Admit: 2024-11-01

## 2024-11-01 DIAGNOSIS — R0789 Other chest pain: Secondary | ICD-10-CM | POA: Diagnosis not present

## 2024-11-01 DIAGNOSIS — F17218 Nicotine dependence, cigarettes, with other nicotine-induced disorders: Secondary | ICD-10-CM | POA: Diagnosis not present

## 2024-11-01 DIAGNOSIS — L989 Disorder of the skin and subcutaneous tissue, unspecified: Secondary | ICD-10-CM | POA: Diagnosis not present

## 2024-11-01 DIAGNOSIS — R5383 Other fatigue: Secondary | ICD-10-CM | POA: Diagnosis not present

## 2024-11-01 DIAGNOSIS — Z0000 Encounter for general adult medical examination without abnormal findings: Secondary | ICD-10-CM | POA: Diagnosis not present

## 2024-11-01 DIAGNOSIS — Z1331 Encounter for screening for depression: Secondary | ICD-10-CM | POA: Diagnosis not present

## 2024-11-01 DIAGNOSIS — I10 Essential (primary) hypertension: Secondary | ICD-10-CM | POA: Diagnosis not present

## 2024-11-09 DIAGNOSIS — F17210 Nicotine dependence, cigarettes, uncomplicated: Secondary | ICD-10-CM | POA: Diagnosis not present

## 2024-11-09 DIAGNOSIS — J449 Chronic obstructive pulmonary disease, unspecified: Secondary | ICD-10-CM | POA: Diagnosis not present

## 2024-11-09 DIAGNOSIS — R911 Solitary pulmonary nodule: Secondary | ICD-10-CM | POA: Diagnosis not present

## 2024-11-09 DIAGNOSIS — J301 Allergic rhinitis due to pollen: Secondary | ICD-10-CM | POA: Diagnosis not present

## 2024-11-09 DIAGNOSIS — K219 Gastro-esophageal reflux disease without esophagitis: Secondary | ICD-10-CM | POA: Diagnosis not present

## 2024-11-19 ENCOUNTER — Other Ambulatory Visit: Payer: Self-pay | Admitting: Nurse Practitioner

## 2024-11-19 DIAGNOSIS — I700 Atherosclerosis of aorta: Secondary | ICD-10-CM

## 2024-11-20 ENCOUNTER — Other Ambulatory Visit: Payer: Self-pay | Admitting: Nurse Practitioner

## 2024-11-20 DIAGNOSIS — I700 Atherosclerosis of aorta: Secondary | ICD-10-CM

## 2024-11-20 DIAGNOSIS — K219 Gastro-esophageal reflux disease without esophagitis: Secondary | ICD-10-CM

## 2024-11-22 ENCOUNTER — Encounter: Payer: BC Managed Care – PPO | Admitting: Nurse Practitioner

## 2024-12-25 ENCOUNTER — Other Ambulatory Visit: Payer: Self-pay

## 2024-12-25 ENCOUNTER — Encounter: Payer: Self-pay | Admitting: Emergency Medicine

## 2024-12-25 ENCOUNTER — Emergency Department

## 2024-12-25 ENCOUNTER — Emergency Department
Admission: EM | Admit: 2024-12-25 | Discharge: 2024-12-25 | Disposition: A | Discharge status: Home / Self Care | Attending: Emergency Medicine | Admitting: Emergency Medicine

## 2024-12-25 DIAGNOSIS — J449 Chronic obstructive pulmonary disease, unspecified: Secondary | ICD-10-CM | POA: Diagnosis not present

## 2024-12-25 DIAGNOSIS — J111 Influenza due to unidentified influenza virus with other respiratory manifestations: Secondary | ICD-10-CM

## 2024-12-25 DIAGNOSIS — R059 Cough, unspecified: Secondary | ICD-10-CM | POA: Diagnosis present

## 2024-12-25 DIAGNOSIS — I10 Essential (primary) hypertension: Secondary | ICD-10-CM | POA: Insufficient documentation

## 2024-12-25 DIAGNOSIS — I2510 Atherosclerotic heart disease of native coronary artery without angina pectoris: Secondary | ICD-10-CM | POA: Diagnosis not present

## 2024-12-25 DIAGNOSIS — R509 Fever, unspecified: Secondary | ICD-10-CM | POA: Insufficient documentation

## 2024-12-25 DIAGNOSIS — R112 Nausea with vomiting, unspecified: Secondary | ICD-10-CM | POA: Diagnosis not present

## 2024-12-25 MED ORDER — ONDANSETRON HCL 4 MG/2ML IJ SOLN
4.0000 mg | Freq: Once | INTRAMUSCULAR | Status: AC
  Administered 2024-12-25: 4 mg via INTRAVENOUS
  Filled 2024-12-25: qty 2

## 2024-12-25 MED ORDER — GUAIFENESIN-CODEINE 100-10 MG/5ML PO SOLN: 10.0000 mL | Freq: Three times a day (TID) | ORAL | 0 refills | Status: AC | PRN

## 2024-12-25 MED ORDER — IPRATROPIUM-ALBUTEROL 0.5-2.5 (3) MG/3ML IN SOLN
3.0000 mL | Freq: Once | RESPIRATORY_TRACT | Status: AC
  Administered 2024-12-25: 3 mL via RESPIRATORY_TRACT
  Filled 2024-12-25: qty 3

## 2024-12-25 MED ORDER — SODIUM CHLORIDE 0.9 % IV BOLUS
1000.0000 mL | Freq: Once | INTRAVENOUS | Status: AC
  Administered 2024-12-25: 1000 mL via INTRAVENOUS

## 2024-12-25 MED ORDER — PREDNISONE 10 MG (21) PO TBPK: ORAL_TABLET | ORAL | 0 refills | Status: AC

## 2024-12-25 NOTE — ED Provider Notes (Signed)
" ° °  [facility] Provider Note    Event Date/Time   First MD Initiated Contact with Patient 12/25/24 0915     (approximate)   History   flu like symptoms   HPI  Amy Ferrell is a 59 y.o. female with history of COPD, GERD, hypertension, CAD and as listed in EMR presents to the emergency department for treatment and evaluation of fever, cough, nausea, vomiting for the past 2 days.  She is unable to keep food or fluids down.  Husband is positive for influenza A.      Physical Exam    Vitals:   12/25/24 0915 12/25/24 1151  BP:  132/74  Pulse:  90  Resp:  18  Temp:    SpO2: 96% 98%    General: Awake, no distress.  CV:  Good peripheral perfusion.  Resp:  Normal effort. Diffuse wheezing Abd:  No distention. Soft Other:     ED Results / Procedures / Treatments   Labs (all labs ordered are listed, but only abnormal results are displayed)  Labs Reviewed - No data to display   EKG  Not indicated.   RADIOLOGY  Image and radiology report reviewed and interpreted by me. Radiology report consistent with the same.  Minor bibasilar atelectasis otherwise no acute concerns.  PROCEDURES:  Critical Care performed: No  Procedures   MEDICATIONS ORDERED IN ED:  Medications  ipratropium-albuterol  (DUONEB) 0.5-2.5 (3) MG/3ML nebulizer solution 3 mL (3 mLs Nebulization Given 12/25/24 0938)  sodium chloride  0.9 % bolus 1,000 mL (0 mLs Intravenous Stopped 12/25/24 1135)  ondansetron  (ZOFRAN ) injection 4 mg (4 mg Intravenous Given 12/25/24 0939)     IMPRESSION / MDM / ASSESSMENT AND PLAN / ED COURSE   I have reviewed the triage note and vital signs. Vital signs are stable   Differential diagnosis includes, but is not limited to, influenza A, influenza B, acute viral syndrome, pneumonia  Patient's presentation is most consistent with acute illness / injury with system symptoms.  59 year old female presenting to the emergency department for  treatment and evaluation of viral symptoms as described in the HPI.  Influenza A a is widespread in the community at this time and her husband is also influenza A+ therefore this is likely the cause of patient's symptoms.  On exam, she is wheezing.  DuoNeb ordered.  She will also receive some IV fluids and Zofran  and then p.o. challenge.  Chest x-ray is negative for acute cardiopulmonary abnormality.  Patient feels better and would like to be discharged home.  Prescription for cough medicine and prednisone  submitted to the patient's pharmacy.  Outpatient follow-up encouraged and ER return precautions discussed.      FINAL CLINICAL IMPRESSION(S) / ED DIAGNOSES   Final diagnoses:  Influenza-like illness     Rx / DC Orders   ED Discharge Orders          Ordered    predniSONE  (STERAPRED UNI-PAK 21 TAB) 10 MG (21) TBPK tablet        12/25/24 1137    guaiFENesin -codeine  100-10 MG/5ML syrup  3 times daily PRN        12/25/24 1137             Note:  This document was prepared using Dragon voice recognition software and may include unintentional dictation errors.   Herlinda Kirk NOVAK, FNP 12/25/24 1525    Arlander Charleston, MD 12/26/24 [phone]  "

## 2024-12-25 NOTE — ED Triage Notes (Signed)
 Husband swabbed positive for flu A at home on Saturday, patient started to feel poorly on Sunday. C/O painful cough, fever, body aches and vomiting.  Tylenol  and Ibuprofen  taken this morning at 0700.
